# Patient Record
Sex: Female | Born: 1985 | Race: Black or African American | Hispanic: No | Marital: Married | Smoking: Never smoker
Health system: Southern US, Community
[De-identification: ages and names within clinical notes are randomized; demographics above are authoritative.]

## PROBLEM LIST (undated history)

## (undated) ENCOUNTER — Inpatient Hospital Stay (HOSPITAL_COMMUNITY): Payer: Self-pay

## (undated) DIAGNOSIS — O139 Gestational [pregnancy-induced] hypertension without significant proteinuria, unspecified trimester: Secondary | ICD-10-CM

## (undated) DIAGNOSIS — E269 Hyperaldosteronism, unspecified: Secondary | ICD-10-CM

## (undated) DIAGNOSIS — E282 Polycystic ovarian syndrome: Secondary | ICD-10-CM

## (undated) DIAGNOSIS — D649 Anemia, unspecified: Secondary | ICD-10-CM

## (undated) DIAGNOSIS — J189 Pneumonia, unspecified organism: Secondary | ICD-10-CM

## (undated) DIAGNOSIS — Z903 Acquired absence of stomach [part of]: Secondary | ICD-10-CM

## (undated) DIAGNOSIS — R011 Cardiac murmur, unspecified: Secondary | ICD-10-CM

## (undated) DIAGNOSIS — B999 Unspecified infectious disease: Secondary | ICD-10-CM

## (undated) DIAGNOSIS — F329 Major depressive disorder, single episode, unspecified: Secondary | ICD-10-CM

## (undated) DIAGNOSIS — N182 Chronic kidney disease, stage 2 (mild): Secondary | ICD-10-CM

## (undated) HISTORY — DX: Pneumonia, unspecified organism: J18.9

## (undated) HISTORY — DX: Cardiac murmur, unspecified: R01.1

## (undated) HISTORY — DX: Unspecified infectious disease: B99.9

## (undated) HISTORY — PX: TONSILLECTOMY: SUR1361

## (undated) HISTORY — DX: Hyperaldosteronism, unspecified: E26.9

## (undated) HISTORY — DX: Major depressive disorder, single episode, unspecified: F32.9

## (undated) HISTORY — DX: Acquired absence of stomach (part of): Z90.3

## (undated) HISTORY — DX: Anemia, unspecified: D64.9

---
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
## 2005-06-03 ENCOUNTER — Emergency Department (HOSPITAL_COMMUNITY): Admission: EM | Admit: 2005-06-03 | Discharge: 2005-06-04 | Payer: Self-pay | Admitting: Emergency Medicine

## 2007-01-09 ENCOUNTER — Emergency Department (HOSPITAL_COMMUNITY): Admission: EM | Admit: 2007-01-09 | Discharge: 2007-01-09 | Payer: Self-pay | Admitting: Emergency Medicine

## 2007-08-11 ENCOUNTER — Emergency Department (HOSPITAL_COMMUNITY): Admission: EM | Admit: 2007-08-11 | Discharge: 2007-08-11 | Payer: Self-pay | Admitting: Emergency Medicine

## 2007-12-03 ENCOUNTER — Emergency Department (HOSPITAL_COMMUNITY): Admission: EM | Admit: 2007-12-03 | Discharge: 2007-12-03 | Payer: Self-pay | Admitting: Emergency Medicine

## 2008-01-17 ENCOUNTER — Emergency Department (HOSPITAL_COMMUNITY): Admission: EM | Admit: 2008-01-17 | Discharge: 2008-01-17 | Payer: Self-pay | Admitting: *Deleted

## 2008-01-18 ENCOUNTER — Emergency Department (HOSPITAL_COMMUNITY): Admission: EM | Admit: 2008-01-18 | Discharge: 2008-01-18 | Payer: Self-pay | Admitting: Emergency Medicine

## 2008-02-06 ENCOUNTER — Emergency Department (HOSPITAL_COMMUNITY): Admission: EM | Admit: 2008-02-06 | Discharge: 2008-02-06 | Payer: Self-pay | Admitting: Emergency Medicine

## 2008-03-18 ENCOUNTER — Emergency Department (HOSPITAL_COMMUNITY): Admission: EM | Admit: 2008-03-18 | Discharge: 2008-03-18 | Payer: Self-pay | Admitting: Emergency Medicine

## 2008-08-24 ENCOUNTER — Emergency Department (HOSPITAL_COMMUNITY): Admission: EM | Admit: 2008-08-24 | Discharge: 2008-08-24 | Payer: Self-pay | Admitting: Emergency Medicine

## 2008-08-25 ENCOUNTER — Emergency Department (HOSPITAL_COMMUNITY): Admission: EM | Admit: 2008-08-25 | Discharge: 2008-08-26 | Payer: Self-pay | Admitting: Emergency Medicine

## 2008-08-30 ENCOUNTER — Other Ambulatory Visit: Admission: RE | Admit: 2008-08-30 | Discharge: 2008-08-30 | Payer: Self-pay | Admitting: Family Medicine

## 2008-10-04 ENCOUNTER — Encounter: Admission: RE | Admit: 2008-10-04 | Discharge: 2009-01-02 | Payer: Self-pay | Admitting: Family Medicine

## 2009-06-30 ENCOUNTER — Ambulatory Visit: Payer: Self-pay | Admitting: Oncology

## 2009-07-29 ENCOUNTER — Ambulatory Visit: Payer: Self-pay | Admitting: Oncology

## 2009-08-04 LAB — CMP (CANCER CENTER ONLY)
AST: 18 U/L (ref 11–38)
Albumin: 3.9 g/dL (ref 3.3–5.5)
Alkaline Phosphatase: 52 U/L (ref 26–84)
Potassium: 4.1 mEq/L (ref 3.3–4.7)
Sodium: 139 mEq/L (ref 128–145)
Total Bilirubin: 0.5 mg/dl (ref 0.20–1.60)
Total Protein: 8 g/dL (ref 6.4–8.1)

## 2009-08-04 LAB — IRON AND TIBC
TIBC: 383 ug/dL (ref 250–470)
UIBC: 332 ug/dL

## 2009-08-04 LAB — CBC WITH DIFFERENTIAL (CANCER CENTER ONLY)
EOS%: 1.4 % (ref 0.0–7.0)
LYMPH%: 28.8 % (ref 14.0–48.0)
MCH: 24.2 pg — ABNORMAL LOW (ref 26.0–34.0)
MCHC: 33.3 g/dL (ref 32.0–36.0)
MONO%: 4.8 % (ref 0.0–13.0)
NEUT#: 6.2 10*3/uL (ref 1.5–6.5)
Platelets: 218 10*3/uL (ref 145–400)
RDW: 16 % — ABNORMAL HIGH (ref 10.5–14.6)

## 2009-08-09 LAB — HYPERCOAGULABLE PANEL, COMPREHENSIVE
Anticardiolipin IgG: 4 GPL U/mL (ref <23)
Anticardiolipin IgM: 3 MPL U/mL (ref <11)
Beta-2 Glyco I IgG: 0 G Units (ref <20)
DRVVT 1:1 Mix: 42.3 secs (ref 36.2–44.3)
DRVVT: 50.1 secs — ABNORMAL HIGH (ref 36.2–44.3)
Homocysteine: 8.3 umol/L (ref 4.0–15.4)
PTTLA 4:1 Mix: 45.8 secs (ref 36.3–48.8)
Protein C Activity: 170 % — ABNORMAL HIGH (ref 75–133)
Protein C, Total: 121 % (ref 70–140)

## 2009-08-31 ENCOUNTER — Ambulatory Visit: Payer: Self-pay | Admitting: Oncology

## 2009-12-14 ENCOUNTER — Ambulatory Visit: Payer: Self-pay | Admitting: Oncology

## 2010-01-04 ENCOUNTER — Other Ambulatory Visit: Admission: RE | Admit: 2010-01-04 | Discharge: 2010-01-04 | Payer: Self-pay | Admitting: Family Medicine

## 2010-01-05 ENCOUNTER — Ambulatory Visit: Payer: Self-pay | Admitting: Oncology

## 2010-03-19 DIAGNOSIS — D649 Anemia, unspecified: Secondary | ICD-10-CM

## 2010-03-19 DIAGNOSIS — F32A Depression, unspecified: Secondary | ICD-10-CM

## 2010-03-19 HISTORY — PX: OTHER SURGICAL HISTORY: SHX169

## 2010-03-19 HISTORY — DX: Anemia, unspecified: D64.9

## 2010-03-19 HISTORY — DX: Depression, unspecified: F32.A

## 2010-04-29 ENCOUNTER — Emergency Department (HOSPITAL_COMMUNITY)
Admission: EM | Admit: 2010-04-29 | Discharge: 2010-04-29 | Disposition: A | Discharge status: Home / Self Care | Payer: BC Managed Care – PPO | Attending: Emergency Medicine | Admitting: Emergency Medicine

## 2010-04-29 DIAGNOSIS — I10 Essential (primary) hypertension: Secondary | ICD-10-CM | POA: Insufficient documentation

## 2010-04-29 DIAGNOSIS — L293 Anogenital pruritus, unspecified: Secondary | ICD-10-CM | POA: Insufficient documentation

## 2010-04-29 DIAGNOSIS — A499 Bacterial infection, unspecified: Secondary | ICD-10-CM | POA: Insufficient documentation

## 2010-04-29 DIAGNOSIS — B9689 Other specified bacterial agents as the cause of diseases classified elsewhere: Secondary | ICD-10-CM | POA: Insufficient documentation

## 2010-04-29 DIAGNOSIS — N760 Acute vaginitis: Secondary | ICD-10-CM | POA: Insufficient documentation

## 2010-04-29 DIAGNOSIS — Z79899 Other long term (current) drug therapy: Secondary | ICD-10-CM | POA: Insufficient documentation

## 2010-04-29 LAB — URINALYSIS, ROUTINE W REFLEX MICROSCOPIC
Bilirubin Urine: NEGATIVE
Ketones, ur: NEGATIVE mg/dL
Protein, ur: NEGATIVE mg/dL
Urobilinogen, UA: 0.2 mg/dL (ref 0.0–1.0)

## 2010-04-29 LAB — URINE MICROSCOPIC-ADD ON

## 2010-04-29 LAB — WET PREP, GENITAL
Trich, Wet Prep: NONE SEEN
WBC, Wet Prep HPF POC: NONE SEEN
Yeast Wet Prep HPF POC: NONE SEEN

## 2010-05-02 LAB — GC/CHLAMYDIA PROBE AMP, GENITAL: GC Probe Amp, Genital: NEGATIVE

## 2010-06-26 LAB — POCT PREGNANCY, URINE: Preg Test, Ur: NEGATIVE

## 2010-12-13 LAB — URINALYSIS, ROUTINE W REFLEX MICROSCOPIC
Glucose, UA: NEGATIVE
Ketones, ur: NEGATIVE
Nitrite: NEGATIVE
Protein, ur: NEGATIVE
Urobilinogen, UA: 0.2

## 2010-12-13 LAB — POCT I-STAT, CHEM 8
Calcium, Ion: 1.24
Creatinine, Ser: 1.1
Glucose, Bld: 87
HCT: 42
Hemoglobin: 14.3
Potassium: 3.6
TCO2: 29

## 2010-12-13 LAB — PREGNANCY, URINE: Preg Test, Ur: NEGATIVE

## 2010-12-18 LAB — COMPREHENSIVE METABOLIC PANEL
Albumin: 3.9
BUN: 11
CO2: 29
Calcium: 9.6
Chloride: 105
Creatinine, Ser: 0.76
GFR calc non Af Amer: >60
Total Bilirubin: 0.6

## 2010-12-18 LAB — DIFFERENTIAL
Basophils Absolute: 0.1
Lymphocytes Relative: 33
Lymphs Abs: 4
Neutro Abs: 7.2

## 2010-12-18 LAB — CBC
HCT: 37.9
MCHC: 33.3
MCV: 79.1
Platelets: 203
WBC: 12.2 — ABNORMAL HIGH

## 2010-12-18 LAB — URINALYSIS, ROUTINE W REFLEX MICROSCOPIC
Bilirubin Urine: NEGATIVE
Hgb urine dipstick: NEGATIVE
Ketones, ur: NEGATIVE
Nitrite: NEGATIVE
Specific Gravity, Urine: 1.031 — ABNORMAL HIGH
Urobilinogen, UA: 0.2

## 2010-12-19 LAB — URINALYSIS, ROUTINE W REFLEX MICROSCOPIC
Nitrite: NEGATIVE
Specific Gravity, Urine: 1.016
Urobilinogen, UA: 1

## 2010-12-19 LAB — POCT PREGNANCY, URINE: Preg Test, Ur: NEGATIVE

## 2010-12-22 LAB — URINALYSIS, ROUTINE W REFLEX MICROSCOPIC
Bilirubin Urine: NEGATIVE
Glucose, UA: NEGATIVE mg/dL
Hgb urine dipstick: NEGATIVE
Specific Gravity, Urine: 1.015 (ref 1.005–1.030)
pH: 6 (ref 5.0–8.0)

## 2010-12-22 LAB — URINE CULTURE: Colony Count: 25000

## 2010-12-22 LAB — POCT PREGNANCY, URINE: Preg Test, Ur: NEGATIVE

## 2012-03-20 LAB — OB RESULTS CONSOLE HGB/HCT, BLOOD: HCT: 34 %

## 2012-03-20 LAB — OB RESULTS CONSOLE ABO/RH

## 2012-03-20 LAB — OB RESULTS CONSOLE RPR: RPR: NONREACTIVE

## 2012-03-20 LAB — OB RESULTS CONSOLE GC/CHLAMYDIA
Chlamydia: NEGATIVE
Gonorrhea: NEGATIVE

## 2012-03-20 LAB — OB RESULTS CONSOLE HEPATITIS B SURFACE ANTIGEN: Hepatitis B Surface Ag: NEGATIVE

## 2012-03-20 LAB — OB RESULTS CONSOLE PLATELET COUNT: Platelets: 183 10*3/uL

## 2012-04-21 ENCOUNTER — Emergency Department (INDEPENDENT_AMBULATORY_CARE_PROVIDER_SITE_OTHER)
Admission: EM | Admit: 2012-04-21 | Discharge: 2012-04-21 | Disposition: A | Discharge status: Home / Self Care | Payer: Managed Care, Other (non HMO) | Source: Home / Self Care

## 2012-04-21 ENCOUNTER — Encounter (HOSPITAL_COMMUNITY): Payer: Self-pay

## 2012-04-21 DIAGNOSIS — Z3490 Encounter for supervision of normal pregnancy, unspecified, unspecified trimester: Secondary | ICD-10-CM

## 2012-04-21 DIAGNOSIS — A084 Viral intestinal infection, unspecified: Secondary | ICD-10-CM

## 2012-04-21 DIAGNOSIS — A088 Other specified intestinal infections: Secondary | ICD-10-CM

## 2012-04-21 LAB — POCT I-STAT, CHEM 8
Calcium, Ion: 1.16 mmol/L (ref 1.12–1.23)
Chloride: 107 mEq/L (ref 96–112)
Glucose, Bld: 85 mg/dL (ref 70–99)
HCT: 37 % (ref 36.0–46.0)
TCO2: 22 mmol/L (ref 0–100)

## 2012-04-21 MED ORDER — SODIUM CHLORIDE 0.9 % IV SOLN
Freq: Once | INTRAVENOUS | Status: AC
  Administered 2012-04-21: 15:00:00 via INTRAVENOUS

## 2012-04-21 MED ORDER — ONDANSETRON HCL 4 MG/2ML IJ SOLN
INTRAMUSCULAR | Status: AC
  Filled 2012-04-21: qty 2

## 2012-04-21 MED ORDER — ONDANSETRON HCL 4 MG PO TABS: 4.0000 mg | ORAL_TABLET | Freq: Four times a day (QID) | ORAL | Status: DC

## 2012-04-21 MED ORDER — ONDANSETRON HCL 4 MG/2ML IJ SOLN
4.0000 mg | Freq: Once | INTRAMUSCULAR | Status: AC
  Administered 2012-04-21: 4 mg via INTRAVENOUS

## 2012-04-21 NOTE — ED Provider Notes (Signed)
History     CSN: [id number]  Arrival date & time 04/21/12  1233   First MD Initiated Contact with Patient 04/21/12 1342      Chief Complaint  Patient presents with  . Abdominal Pain    (Consider location/radiation/quality/duration/timing/severity/associated sxs/prior treatment) HPI Comments: 27 year old female presents with nausea vomiting and diarrhea since 1 AM today. She states that the only new food that she has eaten was a salad yesterday. She is at [redacted] weeks gestation. She describes the abdominal discomfort as crampy in nature and includes her entire abdomen. She denies fever, chills, headache, sore throat, chest pain, dyspnea. Denies urinary symptoms, vaginal discharge or bleeding. She is actively vomiting during the H&P. She does have a primary care provider as well as an obstetrician.   History reviewed. No pertinent past medical history.  History reviewed. No pertinent past surgical history.  History reviewed. No pertinent family history.  History  Substance Use Topics  . Smoking status: Never Smoker   . Smokeless tobacco: Not on file  . Alcohol Use: No    OB History    Grav Para Term Preterm Abortions TAB SAB Ect Mult Living   1               Review of Systems  Constitutional: Positive for activity change and appetite change. Negative for fever.  HENT: Negative.   Respiratory: Negative.   Cardiovascular: Negative.   Gastrointestinal: Positive for nausea, vomiting, abdominal pain and diarrhea. Negative for constipation, blood in stool and abdominal distention.  Genitourinary: Negative.   Musculoskeletal: Negative.   Skin: Negative.   Neurological: Negative.   Psychiatric/Behavioral: Negative.     Allergies  Review of patient's allergies indicates no known allergies.  Home Medications  No current outpatient prescriptions on file.  BP 153/95  Pulse 104  Temp 98.9 F (37.2 C) (Oral)  Resp 22  SpO2 100%  LMP 12/29/2011  Physical Exam  Nursing  note and vitals reviewed. Constitutional: She appears well-developed and well-nourished. No distress.  Eyes: Conjunctivae normal and EOM are normal.  Neck: Normal range of motion. Neck supple.  Cardiovascular: Regular rhythm and normal heart sounds.        Borderline tachycardia  Pulmonary/Chest: Effort normal and breath sounds normal. No respiratory distress. She has no wheezes. She has no rales.  Abdominal: Soft.       Mild tenderness over most of the abdomen. Her abdomen is obese with a fat layer pain over the pelvis. It is difficult to determine whether there is direct tenderness over the pelvis or uterus. No pain was elicited when the probe of the Doppler was pressing directly over the uterus.  Musculoskeletal: Normal range of motion. She exhibits no tenderness.  Skin: Skin is warm and dry.  Psychiatric: She has a normal mood and affect.  FHT's regular at 160 bpm.   ED Course  Procedures (including critical care time)   Labs Reviewed  POCT I-STAT, CHEM 8   No results found.   1. Viral gastroenteritis   2. Pregnancy       MDM   Results for orders placed during the hospital encounter of 04/21/12  POCT I-STAT, CHEM 8      Component Value Range   Sodium 135  135 - 145 mEq/L   Potassium 4.9  3.5 - 5.1 mEq/L   Chloride 107  96 - 112 mEq/L   BUN 10  6 - 23 mg/dL   Creatinine, Ser 1.30  0.50 - 1.10 mg/dL  Glucose, Bld 85  70 - 99 mg/dL   Calcium, Ion 1.61  0.96 - 1.23 mmol/L   TCO2 22  0 - 100 mmol/L   Hemoglobin 12.6  12.0 - 15.0 g/dL   HCT 04.5  40.9 - 81.1 %   Patient states she is feeling better after 1 L of normal saline and 4 mg of Zofran IV. She will be discharged home with a prescription for Zofran 4 mg to take one tablet every 6 hours as needed for nausea and vomiting. She is to drink plenty of fluids to stay well hydrated and recommend Pedialyte to augment the of fluids. If having diarrhea over 3 times a day may take Imodium one tablet only to the point to  slow the diarrhea frequency. Do not follow the directions on the box. Do not take 2 tablets at once. Only take 1 tablet at a time but separated by 6-8 hours and only if having frequent diarrhea stools.  The goal is to slow the diarrhea but not stop it. If you are not having frequent diarrhea do not take this medicine. You must allow the diarrhea to shed the virus from the body. The above information is explained to both the patient and her significant other. For additional problems I recommended going to the [facility] hospital because they have diagnostic means in which to evaluate the condition or status of the pregnancy. Consulted with her obstetrician Dr. Casey Burkitt who agreed with the plan at 4:15PM. DMabe.         Hayden Rasmussen, NP 04/21/12 [id number]

## 2012-04-21 NOTE — ED Notes (Signed)
Attempted to auscultate fetal heart tones w/o success. Pt states she has been able to feel usual fetal movement , denies vaginal bleeding or d/c ; c/o general abdominal cramping

## 2012-04-21 NOTE — ED Notes (Signed)
Out of town yesterday, ate a salad; developed abd pain, cramping; "I think I have food poisoning "; concerned as she is PREGNANT!!!

## 2012-04-22 NOTE — ED Provider Notes (Signed)
Medical screening examination/treatment/procedure(s) were performed by resident physician or non-physician practitioner and as supervising physician I was immediately available for consultation/collaboration.   Barkley Bruns MD.    Linna Hoff, MD 04/22/12 1310

## 2012-05-06 ENCOUNTER — Inpatient Hospital Stay (HOSPITAL_COMMUNITY)
Admission: AD | Admit: 2012-05-06 | Discharge: 2012-05-06 | Disposition: A | Discharge status: Hospice | Payer: Managed Care, Other (non HMO) | Source: Ambulatory Visit | Attending: Obstetrics and Gynecology | Admitting: Obstetrics and Gynecology

## 2012-05-06 ENCOUNTER — Encounter (HOSPITAL_COMMUNITY): Payer: Self-pay | Admitting: *Deleted

## 2012-05-06 ENCOUNTER — Inpatient Hospital Stay (HOSPITAL_COMMUNITY): Payer: Managed Care, Other (non HMO)

## 2012-05-06 DIAGNOSIS — S3991XA Unspecified injury of abdomen, initial encounter: Secondary | ICD-10-CM

## 2012-05-06 DIAGNOSIS — W2209XA Striking against other stationary object, initial encounter: Secondary | ICD-10-CM | POA: Insufficient documentation

## 2012-05-06 DIAGNOSIS — O99891 Other specified diseases and conditions complicating pregnancy: Secondary | ICD-10-CM | POA: Insufficient documentation

## 2012-05-06 DIAGNOSIS — Y92009 Unspecified place in unspecified non-institutional (private) residence as the place of occurrence of the external cause: Secondary | ICD-10-CM | POA: Insufficient documentation

## 2012-05-06 MED ORDER — OXYCODONE-ACETAMINOPHEN 5-325 MG PO TABS
1.0000 | ORAL_TABLET | Freq: Once | ORAL | Status: AC
  Administered 2012-05-06: 1 via ORAL
  Filled 2012-05-06: qty 1

## 2012-05-06 NOTE — MAU Note (Signed)
Pt reports she was moving a large box about 3 hours ago and hit the edge of the door and "rammed" the box into her lower abd. Since that time she has had cramping and pressure in her lower abd. Denies vaginal bleeding.

## 2012-05-06 NOTE — MAU Note (Signed)
Patient is in with c/o left mid abdomen soreness/pain after hitting her abdomen on the door at about 1600pm today. No bruising or redness noted. fht 159-163. Patient denies vaginal bleeding

## 2012-05-06 NOTE — MAU Provider Note (Signed)
  History     CSN: [id number]  Arrival date and time: 05/06/12 1910   None     Chief Complaint  Patient presents with  . Abdominal Injury   HPI  Tracey Alvarez is a 27 y.o. G1P0 at [redacted]w[redacted]d who presents today because she was moving a box, and it got caught on the door and she hit her abdomen. She denies any bleeding or LOF. She has not started to feel fetal movement at this time. She states that she bruises very easily and is concerned about the trauma to her abdomen. She reports 7/10 pain that feels like "she got hit". She states it is a dull aching pain.  History reviewed. No pertinent past medical history.  History reviewed. No pertinent past surgical history.  History reviewed. No pertinent family history.  History  Substance Use Topics  . Smoking status: Never Smoker   . Smokeless tobacco: Not on file  . Alcohol Use: No    Allergies: No Known Allergies  Prescriptions prior to admission  Medication Sig Dispense Refill  . acetaminophen (TYLENOL) 500 MG tablet Take 500 mg by mouth every 6 (six) hours as needed for pain.      Marland Kitchen ondansetron (ZOFRAN) 4 MG tablet Take 1 tablet (4 mg total) by mouth every 6 (six) hours. As needed for nausea or vomiting  15 tablet  0    Review of Systems  Neurological: Negative for dizziness.  Endo/Heme/Allergies: Bruises/bleeds easily.   Physical Exam   Blood pressure 123/54, pulse 74, temperature 98.6 F (37 C), temperature source Oral, resp. rate 20, height 5\' 8"  (1.727 m), weight 122.471 kg (270 lb), SpO2 100.00%.  Physical Exam  Nursing note and vitals reviewed. Constitutional: She is oriented to person, place, and time. She appears well-developed and well-nourished. No distress.  Cardiovascular: Normal rate.   Respiratory: Effort normal.  GI: Soft. She exhibits no distension. There is no tenderness. There is no rebound.  Neurological: She is alert and oriented to person, place, and time.  Skin: Skin is warm and dry.  Psychiatric:  She has a normal mood and affect.    MAU Course  Procedures  *RADIOLOGY REPORT*  Clinical Data: 27 year old female. Blunt trauma. Cramping.  LIMITED OBSTETRIC ULTRASOUND  Number of Fetuses: 1  Heart Rate: 160 bpm  Movement: Present  Presentation: Breech  Placental Location:: Posterolateral on the left  Previa: None  Amniotic Fluid (Subjective): Normal  Vertical Pocket 4.5cm  BPD: 3.3 cm) 16 w 2d EDC: 10/19/2012  MATERNAL FINDINGS:  Cervix: Closed, 3.5 cm in length.  Uterus/Adnexae: Right ovary appears normal measuring 2.8 x 5.1 x  2.7 cm. The left ovary appears normal measuring 2.3 x 4.5 x 1.8  cm. No pelvic free fluid identified.  IMPRESSION:  Viable singleton intrauterine pregnancy with estimated gestational  age of [redacted] weeks and 2 days by biparietal diameter. No uterus or  adnexal abnormality identified.  Recommend followup with non-emergent complete OB 14+ wk US  examination for fetal biometric evaluation and anatomic survey if  not already performed.  Original Report Authenticated By: Erskine Speed, M.D.  2113: Spoke with Dr. Ambrose Mantle, pt is ok for DC home.  Assessment and Plan   1. Blunt abdominal trauma    2nd trimester danger signs reviewed FU with PCP as scheduled  Tawnya Crook 05/06/2012, 8:50 PM

## 2012-05-15 ENCOUNTER — Encounter: Payer: Private Health Insurance - Indemnity | Admitting: Certified Nurse Midwife

## 2012-05-21 ENCOUNTER — Ambulatory Visit: Payer: Private Health Insurance - Indemnity | Admitting: Obstetrics and Gynecology

## 2012-05-21 ENCOUNTER — Encounter: Payer: Self-pay | Admitting: Obstetrics and Gynecology

## 2012-05-21 DIAGNOSIS — Z3689 Encounter for other specified antenatal screening: Secondary | ICD-10-CM

## 2012-05-21 LAB — POCT URINALYSIS DIPSTICK
Blood, UA: NEGATIVE
Glucose, UA: NEGATIVE
Ketones, UA: NEGATIVE
Spec Grav, UA: 1.015

## 2012-05-21 NOTE — Progress Notes (Signed)
NOB interview. Pt transferring from [facility] with records. Pt has had all labs, including 1st trimester screen and AFP. EDC based on 7 wk U/S. Sched for anatomy U/S and NOB W/U with CA 05/29/12. Records available in medical records.

## 2012-05-29 ENCOUNTER — Encounter: Payer: Private Health Insurance - Indemnity | Admitting: Certified Nurse Midwife

## 2012-05-29 ENCOUNTER — Ambulatory Visit: Payer: Private Health Insurance - Indemnity

## 2012-05-29 DIAGNOSIS — Z3689 Encounter for other specified antenatal screening: Secondary | ICD-10-CM

## 2012-05-29 LAB — US OB COMP + 14 WK

## 2012-06-12 ENCOUNTER — Other Ambulatory Visit: Payer: Self-pay | Admitting: Obstetrics and Gynecology

## 2012-06-12 LAB — CBC
MCH: 25.6 pg — ABNORMAL LOW (ref 26.0–34.0)
MCHC: 33.2 g/dL (ref 30.0–36.0)
Platelets: 156 10*3/uL (ref 150–400)
RDW: 15.3 % (ref 11.5–15.5)

## 2012-06-13 LAB — GLUCOSE TOLERANCE, 1 HOUR (50G) W/O FASTING: Glucose, 1 Hour GTT: 116 mg/dL (ref 70–140)

## 2012-06-13 LAB — RPR

## 2012-06-21 ENCOUNTER — Inpatient Hospital Stay (HOSPITAL_COMMUNITY)
Admission: AD | Admit: 2012-06-21 | Discharge: 2012-06-21 | Disposition: A | Discharge status: Home / Self Care | Payer: Managed Care, Other (non HMO) | Source: Ambulatory Visit | Attending: Obstetrics and Gynecology | Admitting: Obstetrics and Gynecology

## 2012-06-21 ENCOUNTER — Encounter (HOSPITAL_COMMUNITY): Payer: Self-pay | Admitting: Obstetrics and Gynecology

## 2012-06-21 DIAGNOSIS — O368121 Decreased fetal movements, second trimester, fetus 1: Secondary | ICD-10-CM

## 2012-06-21 DIAGNOSIS — O368190 Decreased fetal movements, unspecified trimester, not applicable or unspecified: Secondary | ICD-10-CM | POA: Insufficient documentation

## 2012-06-21 DIAGNOSIS — R109 Unspecified abdominal pain: Secondary | ICD-10-CM | POA: Insufficient documentation

## 2012-06-21 LAB — URINALYSIS, ROUTINE W REFLEX MICROSCOPIC
Bilirubin Urine: NEGATIVE
Hgb urine dipstick: NEGATIVE
Ketones, ur: 15 mg/dL — AB
Protein, ur: NEGATIVE mg/dL
Urobilinogen, UA: 0.2 mg/dL (ref 0.0–1.0)

## 2012-06-21 NOTE — MAU Provider Note (Signed)
History   CSN: [id number]  Arrival date and time: 06/21/12 1534   Chief Complaint  Patient presents with  . Abdominal Cramping  . Decreased Fetal Movement   HPI Pt presents to MAU at 22w 6d with complaints of decreased fetal movement over the past two days as well as lower abdominal pain and cramping last night.  Pt states she has been feeling regular fetal movement since approx [redacted]wks gestation.  She states she has only felt the baby move 1-2 times today which is decreased.  She reports intermittent lower abdominal pain last night which she describes as cramping and was midline in location.  She reports the pain persisted for approx 1 minute then resolved.  She states the pain was unchanged by movement.  She did not treat her pain.   She ranks the pain as 6/10 when it occurred.  She states the pain occurred approx 4 to 5 times in an approximate 6hr period of time.  She denies any unusual vaginal discharge or vaginal bleeding.  She denies constipation or gassiness.  She states she has not experienced the pain today.  She denies any changes in activity yesterday.    OB History   Grav Para Term Preterm Abortions TAB SAB Ect Mult Living   1               Past Medical History  Diagnosis Date  . Pneumonia     AS TEEN  . Depression 2012    POST SURGERY; MEDS X 2 WEEKS  . Infection 2005    UIT  . Infection     YEAST X 1  . Anemia 2012    Past Surgical History  Procedure Laterality Date  . Vertical sleeve gasgtrectomy  2012  . Tonsillectomy  AGE 4    Family History  Problem Relation Age of Onset  . Anemia Mother   . Seizures Brother     CHILDHOOD  . Diabetes Maternal Aunt   . Lupus Maternal Aunt   . Aneurysm Maternal Uncle   . Arthritis Maternal Grandmother   . Diabetes Maternal Grandmother   . Hyperlipidemia Maternal Grandmother   . Hypertension Maternal Grandmother   . Stroke Maternal Grandmother   . Heart disease Maternal Grandfather   . Hyperlipidemia Maternal  Grandfather   . Stroke Maternal Grandfather   . Lupus Cousin     History  Substance Use Topics  . Smoking status: Never Smoker   . Smokeless tobacco: Never Used  . Alcohol Use: Yes     Comment: RARELY    Allergies:  Allergies  Allergen Reactions  . Shellfish Allergy Shortness Of Breath    Prescriptions prior to admission  Medication Sig Dispense Refill  . acetaminophen (TYLENOL) 500 MG tablet Take 500 mg by mouth every 6 (six) hours as needed for pain.      . Prenatal Vit-Fe Fumarate-FA (PRENATAL MULTIVITAMIN) TABS Take 1 tablet by mouth daily at 12 noon.        Review of Systems  Constitutional: Negative.   HENT: Negative.   Eyes: Negative.   Respiratory: Negative.   Cardiovascular: Negative.   Gastrointestinal: Positive for heartburn and nausea.       Occas heartburn relieved with Tums and occas nausea, no vomiting.  Genitourinary: Negative.   Musculoskeletal: Negative.   Skin: Negative.   Neurological: Negative.   Endo/Heme/Allergies: Negative.   Psychiatric/Behavioral: Negative.    Physical Exam   Blood pressure 134/79, pulse 79, resp. rate 18, height 5' 8.5" (  1.74 m), weight 278 lb 9.6 oz (126.372 kg), last menstrual period 12/29/2011.  Physical Exam  Constitutional: She is oriented to person, place, and time. She appears well-developed and well-nourished.  HENT:  Head: Normocephalic and atraumatic.  Right Ear: External ear normal.  Left Ear: External ear normal.  Nose: Nose normal.  Eyes: Conjunctivae are normal. Pupils are equal, round, and reactive to light.  Neck: Normal range of motion. Neck supple. No thyromegaly present.  Cardiovascular: Normal rate, regular rhythm and intact distal pulses.   Respiratory: Effort normal and breath sounds normal.  GI: Soft. Bowel sounds are normal. She exhibits no distension. There is no tenderness. There is no rebound and no guarding.  Genitourinary: Uterus normal.  Speculum exam deferred.  Ext genitalia WNL.  BUS  neg.  SVE: closed/thick/no palpable presenting part in pelvis.  Neg CMT.  Ut soft, mobile, non-tender.  Fetal heart rate 158 bpm with doppler.  Musculoskeletal: Normal range of motion.  Neurological: She is alert and oriented to person, place, and time. She has normal reflexes.  Skin: Skin is warm and dry.  Psychiatric: She has a normal mood and affect. Her behavior is normal.    MAU Course  Procedures Results for orders placed during the hospital encounter of 06/21/12 (from the past 24 hour(s))  URINALYSIS, ROUTINE W REFLEX MICROSCOPIC     Status: Abnormal   Collection Time    06/21/12  3:50 PM      Result Value Range   Color, Urine YELLOW  YELLOW   APPearance CLEAR  CLEAR   Specific Gravity, Urine 1.020  1.005 - 1.030   pH 6.0  5.0 - 8.0   Glucose, UA NEGATIVE  NEGATIVE mg/dL   Hgb urine dipstick NEGATIVE  NEGATIVE   Bilirubin Urine NEGATIVE  NEGATIVE   Ketones, ur 15 (*) NEGATIVE mg/dL   Protein, ur NEGATIVE  NEGATIVE mg/dL   Urobilinogen, UA 0.2  0.0 - 1.0 mg/dL   Nitrite NEGATIVE  NEGATIVE   Leukocytes, UA NEGATIVE  NEGATIVE    Assessment and Plan  IUP at 22w 6d Decreased fetal movement Abdominal cramping-resolved Fetal movement patterns at the current gestational age d/w pt.   Revd s/s preterm labor.  F/U as sched at [facility] on 07/10/12 or call for persistent or worsening symptoms.   Judithann Villamar O. 06/21/2012, 4:03 PM

## 2012-06-21 NOTE — MAU Note (Signed)
Pt presents with complaints of cramping since last night and a decrease in fetal movement. States she started feeling the baby move at 19 weeks. Denies any bleeding or LOF.

## 2012-06-21 NOTE — MAU Note (Signed)
Patient presents to MAU with c/o decreased fetal movement. Reports intermittent cramping began yesterday at 2000. Reports last good fetal movement two days ago.  Denies vaginal bleeding, LOF. Last sexual intercourse Monday.

## 2012-08-11 ENCOUNTER — Inpatient Hospital Stay (HOSPITAL_COMMUNITY)
Admission: AD | Admit: 2012-08-11 | Discharge: 2012-08-11 | Disposition: A | Discharge status: Home / Self Care | Payer: Medicaid Other | Source: Ambulatory Visit | Attending: Obstetrics and Gynecology | Admitting: Obstetrics and Gynecology

## 2012-08-11 ENCOUNTER — Encounter (HOSPITAL_COMMUNITY): Payer: Self-pay | Admitting: *Deleted

## 2012-08-11 DIAGNOSIS — O26849 Uterine size-date discrepancy, unspecified trimester: Secondary | ICD-10-CM | POA: Diagnosis present

## 2012-08-11 DIAGNOSIS — R638 Other symptoms and signs concerning food and fluid intake: Secondary | ICD-10-CM | POA: Diagnosis present

## 2012-08-11 DIAGNOSIS — D649 Anemia, unspecified: Secondary | ICD-10-CM | POA: Insufficient documentation

## 2012-08-11 DIAGNOSIS — O368190 Decreased fetal movements, unspecified trimester, not applicable or unspecified: Secondary | ICD-10-CM | POA: Insufficient documentation

## 2012-08-11 DIAGNOSIS — Z903 Acquired absence of stomach [part of]: Secondary | ICD-10-CM

## 2012-08-11 DIAGNOSIS — O99019 Anemia complicating pregnancy, unspecified trimester: Secondary | ICD-10-CM | POA: Insufficient documentation

## 2012-08-11 DIAGNOSIS — R0602 Shortness of breath: Secondary | ICD-10-CM | POA: Insufficient documentation

## 2012-08-11 LAB — URINALYSIS, ROUTINE W REFLEX MICROSCOPIC
Bilirubin Urine: NEGATIVE
Glucose, UA: NEGATIVE mg/dL
Hgb urine dipstick: NEGATIVE
Ketones, ur: NEGATIVE mg/dL
Protein, ur: NEGATIVE mg/dL

## 2012-08-11 NOTE — MAU Note (Signed)
Patient states she has not felt any fetal movement in the last 2-3 hours. Having lower back pain. Denies vaginal bleeding and leaking of fluid.

## 2012-08-11 NOTE — MAU Provider Note (Signed)
History   27 yo G1P0 at 72 1/7 weeks presented c/o decreased FM last 2 days, with 5 movements yesterday and 3 today.  Denied leaking, bleeding, dysuria, HA, or contractions.  Did have episode today where she felt slightly short of breath, with perception of her heart beating fast.  Has had shortness of breath before during pregnancy.  08/07/12: Glucola 135--office will schedule 3 hour GTT. Hgb 9.7 (down from 10.4 at early glucola)--taking Fe once/day.  Patient Active Problem List   Diagnosis Date Noted  . Increased BMI 08/11/2012  . Uterine size date discrepancy 08/11/2012  . 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 Decreased Fetal Movement    OB History   Grav Para Term Preterm Abortions TAB SAB Ect Mult Living   1               Past Medical History  Diagnosis Date  . Pneumonia     AS TEEN  . Depression 2012    POST SURGERY; MEDS X 2 WEEKS  . Infection 2005    UIT  . Infection     YEAST X 1  . Anemia 2012    Past Surgical History  Procedure Laterality Date  . Vertical sleeve gasgtrectomy  2012  . Tonsillectomy  AGE 21    Family History  Problem Relation Age of Onset  . Anemia Mother   . Seizures Brother     CHILDHOOD  . Diabetes Maternal Aunt   . Lupus Maternal Aunt   . Aneurysm Maternal Uncle   . Arthritis Maternal Grandmother   . Diabetes Maternal Grandmother   . Hyperlipidemia Maternal Grandmother   . Hypertension Maternal Grandmother   . Stroke Maternal Grandmother   . Heart disease Maternal Grandfather   . Hyperlipidemia Maternal Grandfather   . Stroke Maternal Grandfather   . Lupus Cousin     History  Substance Use Topics  . Smoking status: Never Smoker   . Smokeless tobacco: Never Used  . Alcohol Use: Yes     Comment: RARELY    Allergies:  Allergies  Allergen Reactions  . Shellfish Allergy Shortness Of Breath    Prescriptions prior to admission  Medication Sig Dispense Refill  .  acetaminophen (TYLENOL) 500 MG tablet Take 500 mg by mouth every 6 (six) hours as needed for pain.      . Prenatal Vit-Fe Fumarate-FA (PRENATAL MULTIVITAMIN) TABS Take 1 tablet by mouth daily at 12 noon.         Physical Exam   Blood pressure 127/60, pulse 98, temperature 97.9 F (36.6 C), resp. rate 20, height 5\' 8"  (1.727 m), weight 287 lb (130.182 kg), last menstrual period 12/29/2011, SpO2 100.00%.  Chest clear Heart RRR without murmur Abd gravid, NT Pelvic--deferred Ext WNL  FHR reassuring, but currently non-reactive. UCs none  ED Course  IUP at 30 1/7 weeks Decreased FM Mild anemia  Plan: NST If remains non-reactive, will plan BPP.   Nigel Bridgeman CNM, [place] 08/11/2012 10:19 PM  Addendum:  Patient has now felt several fetal movements. NST reactive, with several accelerations noted.  No decels. No contractions.  Bedside US shows fetus in vtx presentation, with movement of torso noted, with patient noting that movement and other movement of extremities.  Reviewed results of 3 hour GTT and plan for office to call and schedule 3 hour GTT. Reviewed Hgb of 9.7, with recommendations for increasing Fe to BID. Keep scheduled appt on  6/6 for [place] due to S>D. Reviewed [facility] process.  Nigel Bridgeman, CNM, [place] 08/11/12 11:15pm

## 2012-09-03 ENCOUNTER — Encounter: Payer: Medicaid Other | Attending: Obstetrics and Gynecology | Admitting: *Deleted

## 2012-09-03 VITALS — Ht 68.0 in | Wt 288.2 lb

## 2012-09-03 DIAGNOSIS — O99810 Abnormal glucose complicating pregnancy: Secondary | ICD-10-CM | POA: Insufficient documentation

## 2012-09-03 DIAGNOSIS — Z713 Dietary counseling and surveillance: Secondary | ICD-10-CM | POA: Insufficient documentation

## 2012-09-04 ENCOUNTER — Encounter: Payer: Self-pay | Admitting: *Deleted

## 2012-09-04 NOTE — Progress Notes (Signed)
  Patient was seen on 09/03/12 for Gestational Diabetes self-management class at the [facility]. The following learning objectives were met by the patient during this course:   States the definition of Gestational Diabetes  States why dietary management is important in controlling blood glucose  Describes the effects each nutrient has on blood glucose levels  Demonstrates ability to create a balanced meal plan  Demonstrates carbohydrate counting   States when to check blood glucose levels  Demonstrates proper blood glucose monitoring techniques  States the effect of stress and exercise on blood glucose levels  States the importance of limiting caffeine and abstaining from alcohol and smoking  Blood glucose monitor given: [employer] EZ kit Lot # [id number] Exp: 07/2013 Blood glucose reading: 67 mg/dl  Patient instructed to monitor glucose levels: FBS: 60 - <90 2 hour: <120  *Patient received handouts:  Nutrition Diabetes and Pregnancy  Carbohydrate Counting List  Patient will be seen for follow-up as needed.

## 2012-09-04 NOTE — Patient Instructions (Signed)
Goals:  Check glucose levels per MD as instructed  Follow Gestational Diabetes Diet as instructed  Call for follow-up as needed    

## 2012-10-07 ENCOUNTER — Telehealth (HOSPITAL_COMMUNITY): Payer: Self-pay | Admitting: *Deleted

## 2012-10-07 ENCOUNTER — Encounter (HOSPITAL_COMMUNITY): Payer: Self-pay | Admitting: *Deleted

## 2012-10-07 LAB — OB RESULTS CONSOLE GBS: GBS: POSITIVE

## 2012-10-07 NOTE — Telephone Encounter (Signed)
Preadmission screen  

## 2012-10-09 ENCOUNTER — Encounter (HOSPITAL_COMMUNITY): Payer: Self-pay | Admitting: *Deleted

## 2012-10-09 ENCOUNTER — Telehealth (HOSPITAL_COMMUNITY): Payer: Self-pay | Admitting: *Deleted

## 2012-10-09 ENCOUNTER — Encounter: Payer: Self-pay | Admitting: Obstetrics and Gynecology

## 2012-10-09 DIAGNOSIS — O24419 Gestational diabetes mellitus in pregnancy, unspecified control: Secondary | ICD-10-CM | POA: Insufficient documentation

## 2012-10-09 DIAGNOSIS — Z8632 Personal history of gestational diabetes: Secondary | ICD-10-CM | POA: Insufficient documentation

## 2012-10-09 NOTE — Telephone Encounter (Signed)
Preadmission screen  

## 2012-10-12 ENCOUNTER — Inpatient Hospital Stay (HOSPITAL_COMMUNITY)
Admission: RE | Admit: 2012-10-12 | Payer: Managed Care, Other (non HMO) | Source: Ambulatory Visit | Attending: Obstetrics and Gynecology | Admitting: Obstetrics and Gynecology

## 2012-10-13 ENCOUNTER — Inpatient Hospital Stay (HOSPITAL_COMMUNITY)
Admission: RE | Admit: 2012-10-13 | Discharge: 2012-10-19 | DRG: 765 | Disposition: A | Discharge status: Home / Self Care | Payer: Medicaid Other | Source: Ambulatory Visit | Attending: Obstetrics and Gynecology | Admitting: Obstetrics and Gynecology

## 2012-10-13 ENCOUNTER — Encounter (HOSPITAL_COMMUNITY): Payer: Self-pay | Admitting: *Deleted

## 2012-10-13 DIAGNOSIS — O26849 Uterine size-date discrepancy, unspecified trimester: Secondary | ICD-10-CM | POA: Diagnosis present

## 2012-10-13 DIAGNOSIS — Z22330 Carrier of Group B streptococcus: Secondary | ICD-10-CM

## 2012-10-13 DIAGNOSIS — D62 Acute posthemorrhagic anemia: Secondary | ICD-10-CM | POA: Diagnosis not present

## 2012-10-13 DIAGNOSIS — Z8632 Personal history of gestational diabetes: Secondary | ICD-10-CM | POA: Diagnosis present

## 2012-10-13 DIAGNOSIS — O99892 Other specified diseases and conditions complicating childbirth: Secondary | ICD-10-CM | POA: Diagnosis present

## 2012-10-13 DIAGNOSIS — O24419 Gestational diabetes mellitus in pregnancy, unspecified control: Secondary | ICD-10-CM | POA: Diagnosis present

## 2012-10-13 DIAGNOSIS — O3660X0 Maternal care for excessive fetal growth, unspecified trimester, not applicable or unspecified: Secondary | ICD-10-CM | POA: Diagnosis present

## 2012-10-13 DIAGNOSIS — Z98891 History of uterine scar from previous surgery: Secondary | ICD-10-CM

## 2012-10-13 DIAGNOSIS — O9903 Anemia complicating the puerperium: Secondary | ICD-10-CM | POA: Diagnosis not present

## 2012-10-13 DIAGNOSIS — O99814 Abnormal glucose complicating childbirth: Principal | ICD-10-CM | POA: Diagnosis present

## 2012-10-13 LAB — CBC
HCT: 32.7 % — ABNORMAL LOW (ref 36.0–46.0)
MCV: 77.5 fL — ABNORMAL LOW (ref 78.0–100.0)
Platelets: 153 10*3/uL (ref 150–400)
RBC: 4.22 MIL/uL (ref 3.87–5.11)
RDW: 15 % (ref 11.5–15.5)
WBC: 11.7 10*3/uL — ABNORMAL HIGH (ref 4.0–10.5)

## 2012-10-13 LAB — TYPE AND SCREEN
ABO/RH(D): O POS
Antibody Screen: NEGATIVE

## 2012-10-13 MED ORDER — ONDANSETRON HCL 4 MG/2ML IJ SOLN
4.0000 mg | Freq: Four times a day (QID) | INTRAMUSCULAR | Status: DC | PRN
  Administered 2012-10-15: 4 mg via INTRAVENOUS
  Filled 2012-10-13: qty 2

## 2012-10-13 MED ORDER — NALBUPHINE SYRINGE 5 MG/0.5 ML
10.0000 mg | INJECTION | INTRAMUSCULAR | Status: DC | PRN
  Administered 2012-10-13: 10 mg via INTRAVENOUS
  Filled 2012-10-13 (×2): qty 1

## 2012-10-13 MED ORDER — IBUPROFEN 600 MG PO TABS: 600.0000 mg | ORAL_TABLET | Freq: Four times a day (QID) | ORAL | Status: DC | PRN

## 2012-10-13 MED ORDER — PROMETHAZINE HCL 25 MG/ML IJ SOLN
25.0000 mg | Freq: Once | INTRAMUSCULAR | Status: AC
  Administered 2012-10-13: 25 mg via INTRAVENOUS
  Filled 2012-10-13: qty 1

## 2012-10-13 MED ORDER — CITRIC ACID-SODIUM CITRATE 334-500 MG/5ML PO SOLN
30.0000 mL | ORAL | Status: DC | PRN
  Administered 2012-10-16: 30 mL via ORAL
  Filled 2012-10-13: qty 15

## 2012-10-13 MED ORDER — OXYTOCIN BOLUS FROM INFUSION: 500.0000 mL | INTRAVENOUS | Status: DC

## 2012-10-13 MED ORDER — OXYTOCIN 40 UNITS IN LACTATED RINGERS INFUSION - SIMPLE MED: 62.5000 mL/h | INTRAVENOUS | Status: DC

## 2012-10-13 MED ORDER — PHENYLEPHRINE 40 MCG/ML (10ML) SYRINGE FOR IV PUSH (FOR BLOOD PRESSURE SUPPORT): 80.0000 ug | PREFILLED_SYRINGE | INTRAVENOUS | Status: DC | PRN

## 2012-10-13 MED ORDER — LACTATED RINGERS IV SOLN: 500.0000 mL | Freq: Once | INTRAVENOUS | Status: DC

## 2012-10-13 MED ORDER — TERBUTALINE SULFATE 1 MG/ML IJ SOLN: 0.2500 mg | Freq: Once | INTRAMUSCULAR | Status: AC | PRN

## 2012-10-13 MED ORDER — EPHEDRINE 5 MG/ML INJ: 10.0000 mg | INTRAVENOUS | Status: DC | PRN

## 2012-10-13 MED ORDER — DEXTROSE 5 % IV SOLN
2.5000 10*6.[IU] | INTRAVENOUS | Status: DC
  Administered 2012-10-13 – 2012-10-15 (×13): 2.5 10*6.[IU] via INTRAVENOUS
  Filled 2012-10-13 (×17): qty 2.5

## 2012-10-13 MED ORDER — LIDOCAINE HCL (PF) 1 % IJ SOLN: 30.0000 mL | INTRAMUSCULAR | Status: DC | PRN

## 2012-10-13 MED ORDER — FENTANYL 2.5 MCG/ML BUPIVACAINE 1/10 % EPIDURAL INFUSION (WH - ANES)
14.0000 mL/h | INTRAMUSCULAR | Status: DC | PRN
  Administered 2012-10-15 (×2): 14 mL/h via EPIDURAL
  Filled 2012-10-13 (×3): qty 125

## 2012-10-13 MED ORDER — PHENYLEPHRINE 40 MCG/ML (10ML) SYRINGE FOR IV PUSH (FOR BLOOD PRESSURE SUPPORT)
80.0000 ug | PREFILLED_SYRINGE | INTRAVENOUS | Status: DC | PRN
  Filled 2012-10-13 (×2): qty 5

## 2012-10-13 MED ORDER — PENICILLIN G POTASSIUM 5000000 UNITS IJ SOLR
5.0000 10*6.[IU] | Freq: Once | INTRAVENOUS | Status: AC
  Administered 2012-10-13: 5 10*6.[IU] via INTRAVENOUS
  Filled 2012-10-13: qty 5

## 2012-10-13 MED ORDER — LACTATED RINGERS IV SOLN
INTRAVENOUS | Status: DC
  Administered 2012-10-13 – 2012-10-14 (×2): via INTRAVENOUS
  Administered 2012-10-15: 125 mL/h via INTRAVENOUS
  Administered 2012-10-16 (×4): via INTRAVENOUS

## 2012-10-13 MED ORDER — LACTATED RINGERS IV SOLN
500.0000 mL | INTRAVENOUS | Status: DC | PRN
  Administered 2012-10-14: 500 mL via INTRAVENOUS

## 2012-10-13 MED ORDER — EPHEDRINE 5 MG/ML INJ
10.0000 mg | INTRAVENOUS | Status: DC | PRN
  Filled 2012-10-13 (×2): qty 4

## 2012-10-13 MED ORDER — OXYTOCIN 40 UNITS IN LACTATED RINGERS INFUSION - SIMPLE MED
1.0000 m[IU]/min | INTRAVENOUS | Status: DC
  Administered 2012-10-13: 1 m[IU]/min via INTRAVENOUS
  Filled 2012-10-13: qty 1000

## 2012-10-13 MED ORDER — OXYCODONE-ACETAMINOPHEN 5-325 MG PO TABS: 1.0000 | ORAL_TABLET | ORAL | Status: DC | PRN

## 2012-10-13 MED ORDER — DIPHENHYDRAMINE HCL 50 MG/ML IJ SOLN: 12.5000 mg | INTRAMUSCULAR | Status: DC | PRN

## 2012-10-13 MED ORDER — ACETAMINOPHEN 325 MG PO TABS: 650.0000 mg | ORAL_TABLET | ORAL | Status: DC | PRN

## 2012-10-13 NOTE — Progress Notes (Signed)
Patient ID: Tracey Alvarez, female   DOB: Oct 01, 1985, 27 y.o.   MRN: [id number] Tracey Alvarez is a 27 y.o. G1P0 at [redacted]w[redacted]d admitted for IOL   Subjective: C/o increased pain w ctx, requests pain meds, eventually planning epidural but not ready for it, multiple family members at [facility],   Objective: BP 115/60  Pulse 83  Temp(Src) 98.4 F (36.9 C) (Oral)  Resp 20  Ht 5\' 8"  (1.727 m)  Wt 280 lb (127.007 kg)  BMI 42.58 kg/m2  LMP 12/29/2011     FHT:  FHR: 140 bpm, variability: moderate,  accelerations:  Present,  decelerations:  Absent UC:   regular, every 4-5 minutes SVE:   Dilation: 2 Effacement (%): 80 Station: -1 Exam by:: S Krue Peterka CNM  Pitocin at 5mu   Assessment / Plan: IOL for GDM  Labor: early labor Preeclampsia:  no s/s Fetal Wellbeing:  Category I Pain Control:  IV meds Anticipated MOD:  NSVD  Will give nubain/phenergan IV now Enc pt to sleep Continue low dose pitocin titration  Recheck prn   Update physician PRN   Malissa Hippo 10/13/2012, 10:42 PM

## 2012-10-14 ENCOUNTER — Inpatient Hospital Stay (HOSPITAL_COMMUNITY): Payer: Managed Care, Other (non HMO) | Attending: Obstetrics and Gynecology

## 2012-10-14 LAB — ABO/RH: ABO/RH(D): O POS

## 2012-10-14 LAB — GLUCOSE, CAPILLARY
Glucose-Capillary: 104 mg/dL — ABNORMAL HIGH (ref 70–99)
Glucose-Capillary: 77 mg/dL (ref 70–99)
Glucose-Capillary: 70 mg/dL (ref 70–99)

## 2012-10-14 MED ORDER — PROMETHAZINE HCL 25 MG PO TABS: 25.0000 mg | ORAL_TABLET | Freq: Four times a day (QID) | ORAL | Status: DC | PRN

## 2012-10-14 MED ORDER — MISOPROSTOL 25 MCG QUARTER TABLET
25.0000 ug | ORAL_TABLET | ORAL | Status: DC | PRN
  Administered 2012-10-14 – 2012-10-15 (×2): 25 ug via VAGINAL
  Filled 2012-10-14 (×2): qty 0.25

## 2012-10-14 MED ORDER — TERBUTALINE SULFATE 1 MG/ML IJ SOLN: 0.2500 mg | Freq: Once | INTRAMUSCULAR | Status: AC | PRN

## 2012-10-14 MED ORDER — FENTANYL CITRATE 0.05 MG/ML IJ SOLN
100.0000 ug | INTRAMUSCULAR | Status: DC | PRN
  Administered 2012-10-15: 100 ug via INTRAVENOUS
  Filled 2012-10-14: qty 2

## 2012-10-14 MED ORDER — LACTATED RINGERS IV SOLN: 500.0000 mL | Freq: Once | INTRAVENOUS | Status: DC

## 2012-10-14 MED ORDER — OXYTOCIN 40 UNITS IN LACTATED RINGERS INFUSION - SIMPLE MED
1.0000 m[IU]/min | INTRAVENOUS | Status: DC
  Administered 2012-10-15: 2 m[IU]/min via INTRAVENOUS
  Filled 2012-10-14: qty 1000

## 2012-10-14 MED ORDER — ZOLPIDEM TARTRATE 5 MG PO TABS
5.0000 mg | ORAL_TABLET | Freq: Every evening | ORAL | Status: DC | PRN
  Administered 2012-10-15: 5 mg via ORAL
  Filled 2012-10-14: qty 1

## 2012-10-14 NOTE — Progress Notes (Signed)
  Subjective: Pt reported getting some rest last night.  C/o back pain, encouraged her to get up out of bed and sit in chair or on birth ball to help with back pain.  Offered pain med prn.  Objective: BP 119/92  Pulse 75  Temp(Src) 98.8 F (37.1 C) (Oral)  Resp 18  Ht 5\' 8"  (1.727 m)  Wt 280 lb (127.007 kg)  BMI 42.58 kg/m2  LMP 12/29/2011      FHT:  Cat I UC:   Irregular  SVE:   Dilation: 1.5 Effacement (%): 60 Station: -1 Exam by:: J Ameir Faria CNM Posterior  Assessment / Plan:  Labor: IOL for GDM; Pitocin at 8 milliU Preeclampsia: No s/s Fetal Wellbeing: Cat I Pain Control: Managing through mild UCs well without techniques I/D: GBS pos - on PCN prophylaxis; Intact Anticipated MOD: Continue Pitocin, consider AROM when appropriate; SVD   Victorine Mcnee 10/14/2012, 7:40 AM

## 2012-10-14 NOTE — Progress Notes (Signed)
Subjective: Pt received cytotec by RN at 2125.  After pitocin d/c'd this evening, pt ate and had shower.  No ctxs to report; denies LOF or VB.  GFM.  Pt's parents and s.o. Have been at bs.  Pt agreeable to take sleep aid tonight.    Objective: BP 123/53  Pulse 98  Temp(Src) 98.1 F (36.7 C) (Oral)  Resp 18  Ht 5\' 8"  (1.727 m)  Wt 280 lb (127.007 kg)  BMI 42.58 kg/m2  LMP 12/29/2011     .Marland Kitchen CBG (last 3)   Recent Labs  10/14/12 0343 10/14/12 0911 10/14/12 1350  GLUCAP 104* 77 70    FHT:  FHR: 150 bpm, variability: moderate,  accelerations:  Present,  decelerations:  Absent UC:   rare SVE:   Dilation: 1.5 Effacement (%): 60;70 Station: -1 Exam by:: H Corinda Ammon CN  Labs: Lab Results  Component Value Date   WBC 11.7* 10/13/2012   HGB 11.1* 10/13/2012   HCT 32.7* 10/13/2012   MCV 77.5* 10/13/2012   PLT 153 10/13/2012    Assessment / Plan: 1. [redacted]w[redacted]d 2. IOL for GDM-diet controlled   Labor: ripening for tonight s/p pitocin trial and no progress Preeclampsia:  no signs or symptoms of toxicity Fetal Wellbeing:  Category I Pain Control:  Labor support without medications I/D:  n/a Anticipated MOD:  NSVD 1. C/w MD prn  Jong Rickman H 10/14/2012, 9:43 PM

## 2012-10-14 NOTE — H&P (Signed)
Makenzie Weisner is a 27 y.o. female presenting for IOL. Pt denies ctx, vb or LOF, reports +FM.   HPI:  Maternal Medical History:  Reason for admission: IOL   Fetal activity: Perceived fetal activity is normal.   Last perceived fetal movement was within the past hour.    Prenatal complications: no prenatal complications Prenatal Complications - Diabetes: gestational. Diabetes is managed by diet.      OB History   Grav Para Term Preterm Abortions TAB SAB Ect Mult Living   1              Past Medical History  Diagnosis Date  . Pneumonia     AS TEEN  . Depression 2012    POST SURGERY; MEDS X 2 WEEKS  . Infection 2005    UIT  . Infection     YEAST X 1  . Anemia 2012  . Diabetes mellitus without complication   . Gestational diabetes     diet controlled   Past Surgical History  Procedure Laterality Date  . Vertical sleeve gasgtrectomy  2012  . Tonsillectomy  AGE 57   Family History: family history includes Anemia in her mother; Aneurysm in her maternal uncle; Arthritis in her maternal grandmother; Diabetes in her maternal aunt and maternal grandmother; Heart disease in her maternal grandfather; Hyperlipidemia in her maternal grandfather and maternal grandmother; Hypertension in her maternal grandmother; Lupus in her cousin and maternal aunt; Seizures in her brother; and Stroke in her maternal grandfather and maternal grandmother. Social History:  reports that she has never smoked. She has never used smokeless tobacco. She reports that  drinks alcohol. She reports that she does not use illicit drugs.   Prenatal Transfer Tool  Maternal Diabetes: Yes:  Diabetes Type:  Diet controlled Genetic Screening: Normal Maternal Ultrasounds/Referrals: Normal Fetal Ultrasounds or other Referrals:  None Maternal Substance Abuse:  No Significant Maternal Medications:  None Significant Maternal Lab Results:  Lab values include: Group B Strep negative Other Comments:  None  Review of  Systems  All other systems reviewed and are negative.    Dilation: 2 Effacement (%): 80 Station: -1 Exam by:: S Shelena Castelluccio CNM Blood pressure 112/66, pulse 87, temperature 98.8 F (37.1 C), temperature source Oral, resp. rate 20, height 5\' 8"  (1.727 m), weight 280 lb (127.007 kg), last menstrual period 12/29/2011. Maternal Exam:  Uterine Assessment: Contraction frequency is rare.   Abdomen: Patient reports no abdominal tenderness. Fundal height is LGA.   Estimated fetal weight is 8-9#.   Fetal presentation: vertex  Introitus: Normal vulva. Normal vagina.  Pelvis: adequate for delivery.   Cervix: Cervix evaluated by digital exam.     Fetal Exam Fetal Monitor Review: Mode: ultrasound.   Baseline rate: 140.  Variability: moderate (6-25 bpm).   Pattern: accelerations present and no decelerations.    Fetal State Assessment: Category I - tracings are normal.     Physical Exam  Nursing note and vitals reviewed. Constitutional: She is oriented to person, place, and time. She appears well-developed and well-nourished.  HENT:  Head: Normocephalic.  Eyes: Pupils are equal, round, and reactive to light.  Neck: Normal range of motion.  Cardiovascular: Normal rate, regular rhythm and normal heart sounds.   Respiratory: Effort normal and breath sounds normal.  GI: Soft. Bowel sounds are normal.  Genitourinary: Vagina normal.  Musculoskeletal: Normal range of motion.  Neurological: She is alert and oriented to person, place, and time. She has normal reflexes.  Skin: Skin is warm and  dry.  Psychiatric: She has a normal mood and affect. Her behavior is normal.    Prenatal labs: ABO, Rh: --/--/O POS, O POS (07/28 1720) Antibody: NEG (07/28 1720) Rubella: Immune (01/02 0000) RPR: NON REACTIVE (07/28 1720)  HBsAg: Negative (01/02 0000)  HIV: Non-reactive (01/02 0000)  GBS: Positive (07/22 0000)   Assessment/Plan: IUP at [redacted]w[redacted]d GBS neg FHR reassuring Favorable cervix GDM diet  controlled  Admit to b.s. Per c/w Dr Su Hilt Cbg's q4h, glucomander for cbg >120 Routine L&D orders Pitocin lose dose per protocol  Pain meds prn   Elyna Pangilinan M 10/14/2012, 3:44 AM

## 2012-10-14 NOTE — Progress Notes (Signed)
  Subjective: Pt is "stir crazy and hungry."  Pt feeling UCs but are reported mild.   Objective: BP 126/67  Pulse 70  Temp(Src) 98 F (36.7 C) (Oral)  Resp 20  Ht 5\' 8"  (1.727 m)  Wt 280 lb (127.007 kg)  BMI 42.58 kg/m2  LMP 12/29/2011      FHT:  Cat I UC:   regular, every 3-4 minutes, very mild  SVE:   Dilation: 1.5 Effacement (%): 60 Station: -1 Exam by:: J Athelene Hursey CNM  Assessment / Plan:  Labor: IOL; Pitocin 24 milliU Preeclampsia: no s/s Fetal Wellbeing: Cat I Pain Control: n/a I/D: GBS pos - on PCN prophylaxis; Intact   Anticipated MOD: Pitocin off, IA, eat/shower, plan to start cytotec tonight    Tracey Alvarez 10/14/2012, 6:15 PM

## 2012-10-15 ENCOUNTER — Encounter (HOSPITAL_COMMUNITY): Payer: Self-pay | Admitting: Anesthesiology

## 2012-10-15 ENCOUNTER — Inpatient Hospital Stay (HOSPITAL_COMMUNITY): Payer: Medicaid Other | Admitting: Anesthesiology

## 2012-10-15 LAB — GLUCOSE, CAPILLARY
Glucose-Capillary: 111 mg/dL — ABNORMAL HIGH (ref 70–99)
Glucose-Capillary: 81 mg/dL (ref 70–99)

## 2012-10-15 MED ORDER — BUPIVACAINE HCL (PF) 0.25 % IJ SOLN
INTRAMUSCULAR | Status: DC | PRN
  Administered 2012-10-15: 10 mL via EPIDURAL

## 2012-10-15 MED ORDER — LIDOCAINE HCL (PF) 1 % IJ SOLN
INTRAMUSCULAR | Status: DC | PRN
  Administered 2012-10-15 (×2): 4 mL

## 2012-10-15 MED ORDER — FENTANYL 2.5 MCG/ML BUPIVACAINE 1/10 % EPIDURAL INFUSION (WH - ANES)
INTRAMUSCULAR | Status: DC | PRN
  Administered 2012-10-15: 14 mL/h via EPIDURAL

## 2012-10-15 NOTE — Progress Notes (Signed)
  Subjective: Discussed with pt the R&B of AROM.  Pt verbalized understanding and wishes to proceed.  Objective: BP 171/78  Pulse 78  Temp(Src) 98.8 F (37.1 C) (Oral)  Resp 20  Ht 5\' 8"  (1.727 m)  Wt 280 lb (127.007 kg)  BMI 42.58 kg/m2  SpO2 100%  LMP 12/29/2011      FHT:  Cat II UC:   irregular, every 2-5 minutes  SVE:   3cm / 80 / -1  Assessment / Plan:  AROM with light mec  Labor: IOL; Pitocin 2 milliU Preeclampsia: no s/s  Fetal Wellbeing: Cat II Pain Control: n/a I/D: GBS pos - on PCN prophylaxis Anticipated MOD: SVD   Tracey Alvarez 10/15/2012, 1:37 PM

## 2012-10-15 NOTE — Progress Notes (Signed)
  Subjective: Pt reports feeling some pressure and requested to be checked.  Objective: BP 175/78  Pulse 73  Temp(Src) 99.1 F (37.3 C) (Oral)  Resp 20  Ht 5\' 8"  (1.727 m)  Wt 280 lb (127.007 kg)  BMI 42.58 kg/m2  SpO2 100%  LMP 12/29/2011      FHT:  Cat I UC:   regular, every 2-4 minutes  SVE:   Dilation: 6 Effacement (%): 100 Station: 0 Exam by:: Jurgen Groeneveld,CNM  Assessment / Plan:  Labor: IOL for GDM; active labor; Pitocin at 3 milliU Preeclampsia: No s/s Fetal Wellbeing:  Pain Control: Epidural I/D: GBS pos - on PCN prophylaxis; AROM at 0730  Anticipated MOD: SVD     Tracey Alvarez 10/15/2012, 4:35 PM

## 2012-10-15 NOTE — Progress Notes (Signed)
  Subjective: Pt is resting comfortably with epidural.  RN reported difficulty tracing UCs, with minimal variability.  Objective: BP 171/78  Pulse 78  Temp(Src) 98.8 F (37.1 C) (Oral)  Resp 20  Ht 5\' 8"  (1.727 m)  Wt 280 lb (127.007 kg)  BMI 42.58 kg/m2  SpO2 100%  LMP 12/29/2011      FHT:  Cat II UC:   Q 3 min  SVE:   Dilation: 4 Effacement (%): 100 Station: -1 Exam by:: sgrnc  Assessment / Plan:  Labor: IOL; Pitocin 3 milliU, AROM Preeclampsia: no s/s  Fetal Wellbeing: Cat II Pain Control: epidural I/D: GBS pos - on PCN prophylaxis; AROM at 0730 Anticipated MOD: SVD   Tracey Alvarez 10/15/2012, 1:31 PM

## 2012-10-15 NOTE — Progress Notes (Signed)
Subjective: Pt sleeping well when awoke for SVE.  Received 2 doses of cytotec overnight.  Rested w/ Ambien for sleep aid.  S.o. And parents remain asleep at bs.  Objective: BP 134/69  Pulse 83  Temp(Src) 98.8 F (37.1 C) (Oral)  Resp 18  Ht 5\' 8"  (1.727 m)  Wt 280 lb (127.007 kg)  BMI 42.58 kg/m2  SpO2 99%  LMP 12/29/2011     .Marland Kitchen CBG (last 3)   Recent Labs  10/14/12 1350 10/14/12 2208 10/15/12 0204  GLUCAP 70 118* 94    FHT:  FHR: 140 bpm, variability: moderate,  accelerations:  Present,  decelerations:  Absent UC:   occ'l SVE:   Dilation: 1.5 Effacement (%): 80 Station: -1 Exam by:: h Samariah Hokenson,cnm  Labs: Lab Results  Component Value Date   WBC 11.7* 10/13/2012   HGB 11.1* 10/13/2012   HCT 32.7* 10/13/2012   MCV 77.5* 10/13/2012   PLT 153 10/13/2012    Assessment / Plan: 1. [redacted]w[redacted]d 2. IOL for GDM-diet controlled 3. GBS pos  Labor: serial induction Preeclampsia:  no signs or symptoms of toxicity Fetal Wellbeing:  Category I Pain Control:  Labor support without medications I/D:  n/a Anticipated MOD:  NSVD 1. May have light laboring breakfast 2. Restart pitocin now; consider foley bulb in conjunction, prn. 3. C/w MD prn  Diontre Harps H 10/15/2012, 6:01 AM

## 2012-10-15 NOTE — Progress Notes (Signed)
Subjective: Pt has started having Left lower back pain and some abdominal pain over the last hr.  Has used PCAE 2x thus far; will call anesthesia for redose if current bolus doesn't help and repositioning.  Pitocin on 12mu.    Objective: BP 140/58  Pulse 80  Temp(Src) 98.6 F (37 C) (Axillary)  Resp 20  Ht 5\' 8"  (1.727 m)  Wt 280 lb (127.007 kg)  BMI 42.58 kg/m2  SpO2 100%  LMP 12/29/2011   Total I/O In: -  Out: 600 [Urine:600]  FHT:  FHR: 145 bpm, variability: moderate,  accelerations:  Present,  decelerations:  Absent UC:   irregular, every 2-4 minutes SVE:   Dilation: 8 Effacement (%): 100 Station: 0 Exam by:: Dhillon Comunale CNM Molding noted; good strength from ctx during exam on cx palpated Labs: Lab Results  Component Value Date   WBC 11.7* 10/13/2012   HGB 11.1* 10/13/2012   HCT 32.7* 10/13/2012   MCV 77.5* 10/13/2012   PLT 153 10/13/2012    Assessment / Plan: 1. [redacted]w[redacted]d 2. IOL for GDM 3. GBS pos   Labor: protracted active phase Preeclampsia:  no signs or symptoms of toxicity Fetal Wellbeing:  Category I Pain Control:  Epidural I/D:  n/a Anticipated MOD:  c/s vs nsvd 1. Some descent since last SVE, but no cervical change.  MVU's inconsistent (115-180).  Offered pt options of expectant management and recheck cervix in 1-2 hrs, or proceed with c/s since no cervical change in last  3 hrs.  R/b/a rev'd.  Pt desires expectant management. 2. Dr. Richardson Dopp updated on pt's status.  Tracey Alvarez H 10/15/2012, 11:41 PM

## 2012-10-15 NOTE — Anesthesia Preprocedure Evaluation (Addendum)
Anesthesia Evaluation  Patient identified by MRN, date of birth, ID band Patient awake    Reviewed: Allergy & Precautions, H&P , Patient's Chart, lab work & pertinent test results  Airway Mallampati: III TM Distance: >3 FB Neck ROM: Full    Dental no notable dental hx. (+) Teeth Intact   Pulmonary neg pulmonary ROS, pneumonia -, resolved,  breath sounds clear to auscultation  Pulmonary exam normal       Cardiovascular negative cardio ROS  Rhythm:Regular Rate:Normal     Neuro/Psych PSYCHIATRIC DISORDERS Depression negative neurological ROS     GI/Hepatic Neg liver ROS, GERD-  Medicated and Controlled,S/P Gastric Bypass   Endo/Other  diabetes, Well Controlled, GestationalMorbid obesity  Renal/GU negative Renal ROS  negative genitourinary   Musculoskeletal negative musculoskeletal ROS (+)   Abdominal (+) + obese,   Peds  Hematology negative hematology ROS (+)   Anesthesia Other Findings   Reproductive/Obstetrics (+) Pregnancy                          Anesthesia Physical Anesthesia Plan  ASA: III  Anesthesia Plan: Epidural   Post-op Pain Management:    Induction:   Airway Management Planned: Natural Airway  Additional Equipment:   Intra-op Plan:   Post-operative Plan:   Informed Consent: I have reviewed the patients History and Physical, chart, labs and discussed the procedure including the risks, benefits and alternatives for the proposed anesthesia with the patient or authorized representative who has indicated his/her understanding and acceptance.   Dental advisory given  Plan Discussed with: Anesthesiologist  Anesthesia Plan Comments:         Anesthesia Quick Evaluation

## 2012-10-15 NOTE — Anesthesia Procedure Notes (Signed)
Epidural Patient location during procedure: OB Start time: 10/15/2012 10:43 AM  Staffing Anesthesiologist: Lavone Barrientes A. Performed by: anesthesiologist   Preanesthetic Checklist Completed: patient identified, site marked, surgical consent, pre-op evaluation, timeout performed, IV checked, risks and benefits discussed and monitors and equipment checked  Epidural Patient position: sitting Prep: site prepped and draped and DuraPrep Patient monitoring: continuous pulse ox and blood pressure Approach: midline Injection technique: LOR air  Needle:  Needle type: Tuohy  Needle gauge: 17 G Needle length: 9 cm and 9 Needle insertion depth: 8 cm Catheter type: closed end flexible Catheter size: 19 Gauge Catheter at skin depth: 14 cm Test dose: negative and Other  Assessment Events: blood not aspirated, injection not painful, no injection resistance, negative IV test and no paresthesia  Additional Notes Patient identified. Risks and benefits discussed including failed block, incomplete  Pain control, post dural puncture headache, nerve damage, paralysis, blood pressure Changes, nausea, vomiting, reactions to medications-both toxic and allergic and post Partum back pain. All questions were answered. Patient expressed understanding and wished to proceed. Sterile technique was used throughout procedure. Epidural site was Dressed with sterile barrier dressing. No paresthesias, signs of intravascular injection Or signs of intrathecal spread were encountered.  Patient was more comfortable after the epidural was dosed. Please see RN's note for documentation of vital signs and FHR which are stable.

## 2012-10-15 NOTE — Progress Notes (Signed)
Subjective: Pt is w/o pain.  No other complaints.  IUPC and FSE out/dislodged from earlier today.  No PIH s/s.  BP's higher earlier when cuff on pt's ankle (for comfort); returned to pt's arm and normal now.  Pitocin has not been increased in some time; on 6mu currently.  Objective: BP 141/77  Pulse 84  Temp(Src) 99.3 F (37.4 C) (Oral)  Resp 20  Ht 5\' 8"  (1.727 m)  Wt 280 lb (127.007 kg)  BMI 42.58 kg/m2  SpO2 100%  LMP 12/29/2011     .Marland Kitchen CBG (last 3)   Recent Labs  10/15/12 1218 10/15/12 1459 10/15/12 1923  GLUCAP 111* 81 72  .Marland Kitchen Filed Vitals:   10/15/12 1911 10/15/12 1918 10/15/12 1922 10/15/12 1930  BP: 163/94 138/49 135/76 141/77  Pulse: 103 89 89 84  Temp:   99.3 F (37.4 C)   TempSrc:   Oral   Resp:   20   Height:      Weight:      SpO2:        FHT:  FHR: 150 bpm, variability: moderate,  accelerations:  Present,  decelerations:  Absent UC:   regular, every 2-3 minutes SVE:   Dilation: 8 Effacement (%): 100 Station: 0 Exam by:: H Caymen Dubray CNM IUPC replaced w/o difficulty; MVU's appear adequate Labs: Lab Results  Component Value Date   WBC 11.7* 10/13/2012   HGB 11.1* 10/13/2012   HCT 32.7* 10/13/2012   MCV 77.5* 10/13/2012   PLT 153 10/13/2012    Assessment / Plan: 1. [redacted]w[redacted]d 2. IOL for GDM-diet controlled 3. GBS pos  Labor: transition Preeclampsia:  no signs or symptoms of toxicity Fetal Wellbeing:  Category I Pain Control:  Epidural I/D:  n/a Anticipated MOD:  NSVD 1. Will increase pitocin since hasn't been titrated in several hours.  CTO closely for labor progression secondary to GDM and possible LGA fetus. 2. C/w MD prn  Tracey Alvarez H 10/15/2012, 7:45 PM

## 2012-10-16 ENCOUNTER — Encounter (HOSPITAL_COMMUNITY): Payer: Self-pay | Admitting: Anesthesiology

## 2012-10-16 ENCOUNTER — Encounter (HOSPITAL_COMMUNITY)
Admission: RE | Disposition: A | Discharge status: Home / Self Care | Payer: Self-pay | Source: Ambulatory Visit | Attending: Obstetrics and Gynecology

## 2012-10-16 DIAGNOSIS — Z98891 History of uterine scar from previous surgery: Secondary | ICD-10-CM

## 2012-10-16 LAB — CBC
Hemoglobin: 9.3 g/dL — ABNORMAL LOW (ref 12.0–15.0)
MCH: 25.4 pg — ABNORMAL LOW (ref 26.0–34.0)
MCHC: 33.2 g/dL (ref 30.0–36.0)
MCV: 76.5 fL — ABNORMAL LOW (ref 78.0–100.0)
RBC: 3.66 MIL/uL — ABNORMAL LOW (ref 3.87–5.11)

## 2012-10-16 SURGERY — Surgical Case
Anesthesia: Epidural | Site: Abdomen | Wound class: Clean Contaminated

## 2012-10-16 MED ORDER — MEPERIDINE HCL 25 MG/ML IJ SOLN
INTRAMUSCULAR | Status: DC | PRN
  Administered 2012-10-16 (×2): 12.5 mg via INTRAVENOUS

## 2012-10-16 MED ORDER — SIMETHICONE 80 MG PO CHEW: 80.0000 mg | CHEWABLE_TABLET | ORAL | Status: DC | PRN

## 2012-10-16 MED ORDER — OXYTOCIN 40 UNITS IN LACTATED RINGERS INFUSION - SIMPLE MED: 62.5000 mL/h | INTRAVENOUS | Status: AC

## 2012-10-16 MED ORDER — LACTATED RINGERS IV SOLN
INTRAVENOUS | Status: DC
  Administered 2012-10-16: 12:00:00 via INTRAVENOUS

## 2012-10-16 MED ORDER — ONDANSETRON HCL 4 MG/2ML IJ SOLN
4.0000 mg | INTRAMUSCULAR | Status: DC | PRN
  Administered 2012-10-16: 4 mg via INTRAVENOUS

## 2012-10-16 MED ORDER — SCOPOLAMINE 1 MG/3DAYS TD PT72
MEDICATED_PATCH | TRANSDERMAL | Status: AC
  Filled 2012-10-16: qty 1

## 2012-10-16 MED ORDER — ONDANSETRON HCL 4 MG/2ML IJ SOLN
INTRAMUSCULAR | Status: AC
  Filled 2012-10-16: qty 2

## 2012-10-16 MED ORDER — OXYTOCIN 10 UNIT/ML IJ SOLN
INTRAMUSCULAR | Status: AC
  Filled 2012-10-16: qty 4

## 2012-10-16 MED ORDER — MIDAZOLAM HCL 2 MG/2ML IJ SOLN: 0.5000 mg | Freq: Once | INTRAMUSCULAR | Status: DC | PRN

## 2012-10-16 MED ORDER — OXYTOCIN 10 UNIT/ML IJ SOLN
40.0000 [IU] | INTRAVENOUS | Status: DC | PRN
  Administered 2012-10-16: 40 [IU] via INTRAVENOUS

## 2012-10-16 MED ORDER — FENTANYL CITRATE 0.05 MG/ML IJ SOLN
INTRAMUSCULAR | Status: AC
  Filled 2012-10-16: qty 2

## 2012-10-16 MED ORDER — DIPHENHYDRAMINE HCL 50 MG/ML IJ SOLN: 25.0000 mg | INTRAMUSCULAR | Status: DC | PRN

## 2012-10-16 MED ORDER — 0.9 % SODIUM CHLORIDE (POUR BTL) OPTIME
TOPICAL | Status: DC | PRN
  Administered 2012-10-16 (×2): 1000 mL

## 2012-10-16 MED ORDER — IBUPROFEN 600 MG PO TABS
600.0000 mg | ORAL_TABLET | Freq: Four times a day (QID) | ORAL | Status: DC
  Administered 2012-10-16 – 2012-10-17 (×5): 600 mg via ORAL
  Filled 2012-10-16 (×5): qty 1

## 2012-10-16 MED ORDER — NALBUPHINE HCL 10 MG/ML IJ SOLN
5.0000 mg | INTRAMUSCULAR | Status: DC | PRN
  Administered 2012-10-16: 5 mg via INTRAVENOUS
  Filled 2012-10-16: qty 1

## 2012-10-16 MED ORDER — MORPHINE SULFATE (PF) 0.5 MG/ML IJ SOLN
INTRAMUSCULAR | Status: DC | PRN
  Administered 2012-10-16: 1500 ug via INTRAVENOUS
  Administered 2012-10-16: 3500 ug via EPIDURAL

## 2012-10-16 MED ORDER — SIMETHICONE 80 MG PO CHEW
80.0000 mg | CHEWABLE_TABLET | Freq: Three times a day (TID) | ORAL | Status: DC
  Administered 2012-10-16 – 2012-10-19 (×11): 80 mg via ORAL

## 2012-10-16 MED ORDER — MORPHINE SULFATE 0.5 MG/ML IJ SOLN
INTRAMUSCULAR | Status: AC
  Filled 2012-10-16: qty 10

## 2012-10-16 MED ORDER — DIBUCAINE 1 % RE OINT: 1.0000 "application " | TOPICAL_OINTMENT | RECTAL | Status: DC | PRN

## 2012-10-16 MED ORDER — SENNOSIDES-DOCUSATE SODIUM 8.6-50 MG PO TABS
2.0000 | ORAL_TABLET | Freq: Every day | ORAL | Status: DC
  Administered 2012-10-17 – 2012-10-19 (×2): 2 via ORAL

## 2012-10-16 MED ORDER — SODIUM CHLORIDE 0.9 % IJ SOLN: 3.0000 mL | INTRAMUSCULAR | Status: DC | PRN

## 2012-10-16 MED ORDER — LANOLIN HYDROUS EX OINT: 1.0000 "application " | TOPICAL_OINTMENT | CUTANEOUS | Status: DC | PRN

## 2012-10-16 MED ORDER — MEPERIDINE HCL 25 MG/ML IJ SOLN
INTRAMUSCULAR | Status: AC
  Filled 2012-10-16: qty 1

## 2012-10-16 MED ORDER — ONDANSETRON HCL 4 MG PO TABS: 4.0000 mg | ORAL_TABLET | ORAL | Status: DC | PRN

## 2012-10-16 MED ORDER — DEXTROSE 5 % IV SOLN
3.0000 g | Freq: Once | INTRAVENOUS | Status: AC
  Administered 2012-10-16: 3 g via INTRAVENOUS
  Filled 2012-10-16: qty 3000

## 2012-10-16 MED ORDER — KETOROLAC TROMETHAMINE 30 MG/ML IJ SOLN
INTRAMUSCULAR | Status: AC
  Filled 2012-10-16: qty 1

## 2012-10-16 MED ORDER — PRENATAL MULTIVITAMIN CH
1.0000 | ORAL_TABLET | Freq: Every day | ORAL | Status: DC
  Administered 2012-10-16 – 2012-10-18 (×3): 1 via ORAL
  Filled 2012-10-16 (×3): qty 1

## 2012-10-16 MED ORDER — FERROUS SULFATE 325 (65 FE) MG PO TABS
325.0000 mg | ORAL_TABLET | Freq: Two times a day (BID) | ORAL | Status: DC
  Administered 2012-10-16 – 2012-10-19 (×7): 325 mg via ORAL
  Filled 2012-10-16 (×7): qty 1

## 2012-10-16 MED ORDER — MEPERIDINE HCL 25 MG/ML IJ SOLN: 6.2500 mg | INTRAMUSCULAR | Status: DC | PRN

## 2012-10-16 MED ORDER — SODIUM BICARBONATE 8.4 % IV SOLN
INTRAVENOUS | Status: AC
  Filled 2012-10-16: qty 50

## 2012-10-16 MED ORDER — KETOROLAC TROMETHAMINE 30 MG/ML IJ SOLN
30.0000 mg | Freq: Four times a day (QID) | INTRAMUSCULAR | Status: AC | PRN
  Administered 2012-10-16: 30 mg via INTRAVENOUS

## 2012-10-16 MED ORDER — ZOLPIDEM TARTRATE 5 MG PO TABS: 5.0000 mg | ORAL_TABLET | Freq: Every evening | ORAL | Status: DC | PRN

## 2012-10-16 MED ORDER — DIPHENHYDRAMINE HCL 25 MG PO CAPS: 25.0000 mg | ORAL_CAPSULE | Freq: Four times a day (QID) | ORAL | Status: DC | PRN

## 2012-10-16 MED ORDER — NALOXONE HCL 1 MG/ML IJ SOLN
1.0000 ug/kg/h | INTRAVENOUS | Status: DC | PRN
  Filled 2012-10-16: qty 2

## 2012-10-16 MED ORDER — NALOXONE HCL 0.4 MG/ML IJ SOLN: 0.4000 mg | INTRAMUSCULAR | Status: DC | PRN

## 2012-10-16 MED ORDER — DIPHENHYDRAMINE HCL 25 MG PO CAPS
25.0000 mg | ORAL_CAPSULE | ORAL | Status: DC | PRN
  Filled 2012-10-16: qty 1

## 2012-10-16 MED ORDER — OXYCODONE-ACETAMINOPHEN 5-325 MG PO TABS
1.0000 | ORAL_TABLET | ORAL | Status: DC | PRN
  Administered 2012-10-17: 2 via ORAL
  Administered 2012-10-17: 1 via ORAL
  Filled 2012-10-16: qty 2
  Filled 2012-10-16: qty 1

## 2012-10-16 MED ORDER — WITCH HAZEL-GLYCERIN EX PADS: 1.0000 "application " | MEDICATED_PAD | CUTANEOUS | Status: DC | PRN

## 2012-10-16 MED ORDER — SODIUM BICARBONATE 8.4 % IV SOLN
INTRAVENOUS | Status: DC | PRN
  Administered 2012-10-16: 10 mL via EPIDURAL

## 2012-10-16 MED ORDER — DIPHENHYDRAMINE HCL 50 MG/ML IJ SOLN: 12.5000 mg | INTRAMUSCULAR | Status: DC | PRN

## 2012-10-16 MED ORDER — KETOROLAC TROMETHAMINE 30 MG/ML IJ SOLN: 30.0000 mg | Freq: Four times a day (QID) | INTRAMUSCULAR | Status: AC | PRN

## 2012-10-16 MED ORDER — NALBUPHINE HCL 10 MG/ML IJ SOLN
5.0000 mg | INTRAMUSCULAR | Status: DC | PRN
  Filled 2012-10-16 (×2): qty 1

## 2012-10-16 MED ORDER — LIDOCAINE HCL (CARDIAC) 20 MG/ML IV SOLN
INTRAVENOUS | Status: AC
  Filled 2012-10-16: qty 5

## 2012-10-16 MED ORDER — METOCLOPRAMIDE HCL 5 MG/ML IJ SOLN: 10.0000 mg | Freq: Three times a day (TID) | INTRAMUSCULAR | Status: DC | PRN

## 2012-10-16 MED ORDER — FENTANYL CITRATE 0.05 MG/ML IJ SOLN
25.0000 ug | INTRAMUSCULAR | Status: DC | PRN
  Administered 2012-10-16 (×2): 50 ug via INTRAVENOUS

## 2012-10-16 MED ORDER — SCOPOLAMINE 1 MG/3DAYS TD PT72
1.0000 | MEDICATED_PATCH | Freq: Once | TRANSDERMAL | Status: AC
  Administered 2012-10-16: 1.5 mg via TRANSDERMAL

## 2012-10-16 MED ORDER — ONDANSETRON HCL 4 MG/2ML IJ SOLN
INTRAMUSCULAR | Status: DC | PRN
  Administered 2012-10-16: 4 mg via INTRAVENOUS

## 2012-10-16 MED ORDER — ONDANSETRON HCL 4 MG/2ML IJ SOLN
4.0000 mg | Freq: Three times a day (TID) | INTRAMUSCULAR | Status: DC | PRN
  Filled 2012-10-16: qty 2

## 2012-10-16 MED ORDER — PROMETHAZINE HCL 25 MG/ML IJ SOLN: 6.2500 mg | INTRAMUSCULAR | Status: DC | PRN

## 2012-10-16 MED ORDER — MENTHOL 3 MG MT LOZG: 1.0000 | LOZENGE | OROMUCOSAL | Status: DC | PRN

## 2012-10-16 SURGICAL SUPPLY — 42 items
APL SKNCLS STERI-STRIP NONHPOA (GAUZE/BANDAGES/DRESSINGS) ×2
BARRIER ADHS 3X4 INTERCEED (GAUZE/BANDAGES/DRESSINGS) ×1 IMPLANT
BENZOIN TINCTURE PRP APPL 2/3 (GAUZE/BANDAGES/DRESSINGS) ×3 IMPLANT
BRR ADH 4X3 ABS CNTRL BYND (GAUZE/BANDAGES/DRESSINGS) ×1
CHLORAPREP W/TINT 26ML (MISCELLANEOUS) ×1 IMPLANT
CLAMP CORD UMBIL (MISCELLANEOUS) ×1 IMPLANT
CLOTH BEACON ORANGE TIMEOUT ST (SAFETY) ×2 IMPLANT
CONTAINER PREFILL 10% NBF 15ML (MISCELLANEOUS) IMPLANT
DRAPE LG THREE QUARTER DISP (DRAPES) ×2 IMPLANT
DRSG OPSITE POSTOP 4X10 (GAUZE/BANDAGES/DRESSINGS) ×2 IMPLANT
DURAPREP 26ML APPLICATOR (WOUND CARE) ×1 IMPLANT
ELECT REM PT RETURN 9FT ADLT (ELECTROSURGICAL) ×2
ELECTRODE REM PT RTRN 9FT ADLT (ELECTROSURGICAL) ×1 IMPLANT
EXTRACTOR VACUUM M CUP 4 TUBE (SUCTIONS) IMPLANT
GLOVE BIOGEL M 6.5 STRL (GLOVE) ×4 IMPLANT
GLOVE BIOGEL PI IND STRL 6.5 (GLOVE) ×1 IMPLANT
GLOVE BIOGEL PI INDICATOR 6.5 (GLOVE) ×1
GOWN PREVENTION PLUS XLARGE (GOWN DISPOSABLE) ×2 IMPLANT
GOWN STRL REIN XL XLG (GOWN DISPOSABLE) ×4 IMPLANT
KIT ABG SYR 3ML LUER SLIP (SYRINGE) IMPLANT
NDL HYPO 25X5/8 SAFETYGLIDE (NEEDLE) IMPLANT
NEEDLE HYPO 25X5/8 SAFETYGLIDE (NEEDLE) IMPLANT
NS IRRIG 1000ML POUR BTL (IV SOLUTION) ×3 IMPLANT
PACK C SECTION WH (CUSTOM PROCEDURE TRAY) ×2 IMPLANT
PAD OB MATERNITY 4.3X12.25 (PERSONAL CARE ITEMS) ×2 IMPLANT
RTRCTR C-SECT PINK 25CM LRG (MISCELLANEOUS) ×1 IMPLANT
RTRCTR C-SECT PINK 34CM XLRG (MISCELLANEOUS) IMPLANT
SPONGE LAP 18X18 X RAY DECT (DISPOSABLE) ×1 IMPLANT
STAPLER VISISTAT 35W (STAPLE) IMPLANT
STRIP CLOSURE SKIN 1/2X4 (GAUZE/BANDAGES/DRESSINGS) ×2 IMPLANT
SUT PDS AB 0 CT1 27 (SUTURE) ×4 IMPLANT
SUT PLAIN 0 NONE (SUTURE) IMPLANT
SUT VIC AB 0 CTX 36 (SUTURE) ×10
SUT VIC AB 0 CTX36XBRD ANBCTRL (SUTURE) ×3 IMPLANT
SUT VIC AB 2-0 CT1 27 (SUTURE) ×4
SUT VIC AB 2-0 CT1 TAPERPNT 27 (SUTURE) ×1 IMPLANT
SUT VIC AB 3-0 SH 27 (SUTURE) ×2
SUT VIC AB 3-0 SH 27X BRD (SUTURE) IMPLANT
SUT VIC AB 4-0 KS 27 (SUTURE) ×2 IMPLANT
TOWEL OR 17X24 6PK STRL BLUE (TOWEL DISPOSABLE) ×6 IMPLANT
TRAY FOLEY CATH 14FR (SET/KITS/TRAYS/PACK) ×1 IMPLANT
WATER STERILE IRR 1000ML POUR (IV SOLUTION) IMPLANT

## 2012-10-16 NOTE — Anesthesia Postprocedure Evaluation (Signed)
  Anesthesia Post Note  Patient: Tracey Alvarez  Procedure(s) Performed: Procedure(s) (LRB): Primary Cesarean Section Delivery Baby Girl @ [phone], Apgars 8/9 (N/A)  Anesthesia type: Epidural  Patient location: PACU  Post pain: Pain level controlled  Post assessment: Post-op Vital signs reviewed  Last Vitals:  Filed Vitals:   10/16/12 0500  BP: 133/63  Pulse: 93  Temp:   Resp: 24    Post vital signs: Reviewed  Level of consciousness: awake  Complications: No apparent anesthesia complications

## 2012-10-16 NOTE — OR Nursing (Signed)
Approximately 10 ml's expressed on Uterine massage.   Uterus firm 1 below Uterus.

## 2012-10-16 NOTE — Progress Notes (Signed)
Subjective: Pitocin currently on 16mu.  Pt had initial relief after epidural redosed, but again w/ mild breakthrough pain in back and abdomen.  Family remains at bs.    Objective: BP 128/72  Pulse 99  Temp(Src) 98.3 F (36.8 C) (Axillary)  Resp 20  Ht 5\' 8"  (1.727 m)  Wt 280 lb (127.007 kg)  BMI 42.58 kg/m2  SpO2 100%  LMP 12/29/2011   Total I/O In: -  Out: 1400 [Urine:1400] .Marland Kitchen CBG (last 3)   Recent Labs  10/15/12 1923 10/15/12 2306 10/16/12 0205  GLUCAP 72 71 92    FHT:  FHR: 145 bpm, variability: moderate,  accelerations:  Present,  decelerations:  Absent UC:   regular, every 1.5-3 minutes SVE:   8-9/100/0  Cx more on pt's right than left MVU's 180-215 Labs: Lab Results  Component Value Date   WBC 11.7* 10/13/2012   HGB 11.1* 10/13/2012   HCT 32.7* 10/13/2012   MCV 77.5* 10/13/2012   PLT 153 10/13/2012    Assessment / Plan: 1. [redacted]w[redacted]d 2. IOL for GDM 3. GBS pos  Labor: arrest in dilatation/protracted active phase Preeclampsia:  no signs or symptoms of toxicity Fetal Wellbeing:  Category I Pain Control:  Epidural I/D:  n/a Anticipated MOD:  in light of adequate MVU's and no cervical change, c/s recommended for FTP.  r/b/a rev'd with pt, including but no limited to risk of bleeding, infection, damage to surrounding organs, and anesthesia complications.  pt verbalized understanding of risks following discussion and agreeable to proceed with c/s for delivery.  Dr. Richardson Dopp updated and en route.  Keelen Quevedo H 10/16/2012, 2:15 AM

## 2012-10-16 NOTE — Progress Notes (Signed)
UR chart review completed.  

## 2012-10-16 NOTE — Anesthesia Postprocedure Evaluation (Signed)
Anesthesia Post Note  Patient: Tracey Alvarez  Procedure(s) Performed: Procedure(s) (LRB): Primary Cesarean Section Delivery Baby Girl @ [phone], Apgars 8/9 (N/A)  Anesthesia type: Epidural  Patient location: Mother/Baby  Post pain: Pain level controlled  Post assessment: Post-op Vital signs reviewed  Last Vitals:  Filed Vitals:   10/16/12 0633  BP: 124/74  Pulse: 91  Temp: 36.8 C  Resp: 20    Post vital signs: Reviewed  Level of consciousness:alert  Complications: No apparent anesthesia complications Anesthesia Post-op Note  Patient: Tracey Alvarez  Procedure(s) Performed: Procedure(s): Primary Cesarean Section Delivery Baby Girl @ [phone], Apgars 8/9 (N/A)  Patient Location: PACU and Mother/Baby  Anesthesia Type:Epidural  Level of Consciousness: awake, alert , oriented and patient cooperative  Airway and Oxygen Therapy: Patient Spontanous Breathing  Post-op Pain: none  Post-op Assessment: Post-op Vital signs reviewed, No signs of Nausea or vomiting, Adequate PO intake, Pain level controlled, No headache, No backache, No residual numbness and No residual motor weakness  Post-op Vital Signs: Reviewed and stable  Complications: No apparent anesthesia complications

## 2012-10-16 NOTE — Op Note (Signed)
Cesarean Section Procedure Note  Indications: failure to progress: arrest of dilation  Pre-operative Diagnosis: 39 week 4 day pregnancy.  Post-operative Diagnosis: same  Surgeon: Jessee Avers.   Assistants: Robinette Haines CNM  Anesthesia: Epidural anesthesia  ASA Class: 2   Procedure Details   The patient was seen in the Holding Room. The risks, benefits, complications, treatment options, and expected outcomes were discussed with the patient.  The patient concurred with the proposed plan, giving informed consent.  The site of surgery properly noted/marked. The patient was taken to Operating Room # 9, identified as Tracey Alvarez and the procedure verified as C-Section Delivery. A Time Out was held and the above information confirmed.  After induction of anesthesia, the patient was draped and prepped in the usual sterile manner. A Pfannenstiel incision was made and carried down through the subcutaneous tissue to the fascia. Fascial incision was made and extended transversely. The fascia was separated from the underlying rectus tissue superiorly and inferiorly. The peritoneum was identified and entered. Peritoneal incision was extended longitudinally. The utero-vesical peritoneal reflection was incised transversely and the bladder flap was bluntly freed from the lower uterine segment. A low transverse uterine incision was made. Delivered from cephalic presentation was a  Female with Apgar scores of 9 at one minute and 9 at five minutes. After the umbilical cord was clamped and cut cord blood was obtained for evaluation. The placenta was removed intact and appeared normal. The uterine outline, tubes and ovaries appeared normal. The uterine incision was closed with running locked sutures of  0 vicryl. A second layer of 0 vicryl was used to imbricate the incision. Pt had bleeding from the mid portion of the incision this was controlled with a figur of eight stitch of 0 vicryl.  Hemostasis  was observed. Lavage was carried out until clear. The peritoneum was reapproximated with 2-0 vicryl.  The fascia was then reapproximated with running sutures of 0 pds. The skin was reapproximated with 4-0 vicryl .  Instrument, sponge, and needle counts were correct prior the abdominal closure and at the conclusion of the case.   Findings: Female infant in cephalic presentation.. Normal fallopian tubes and ovaries.   Estimated Blood Loss:  800 ml          Drains: foley catheter          Total IV Fluids:  2000 ml         Specimens: Placenta and sent to labor and delivery            Implants: None         Complications:  None; patient tolerated the procedure well.         Disposition: PACU - hemodynamically stable.         Condition: stable  Attending Attestation: I performed the procedure.

## 2012-10-16 NOTE — Progress Notes (Signed)
Tracey Alvarez is a 27 y.o. G1P0 at [redacted]w[redacted]d by LMP admitted for induction of labor due to Gestational diabetes.  Subjective:  Called to see patient by Midwife Tracey Alvarez due to failure to progress. MVU s have been adequate over the last 2 hours. Pt comfortable with epidural . + FM    Objective: BP 149/76  Pulse 95  Temp(Src) 98.3 F (36.8 C) (Axillary)  Resp 18  Ht 5\' 8"  (1.727 m)  Wt 127.007 kg (280 lb)  BMI 42.58 kg/m2  SpO2 100%  LMP 12/29/2011   Total I/O In: -  Out: 1400 [Urine:1400]  FHT:  FHR: 140 bpm, variability: moderate,  accelerations:  Present,  decelerations:  Absent UC:   regular, every 2 minutes SVE:   Dilation: 8 (unchanged) Effacement (%): 100 Station: 0 Exam by:: Tracey Alvarez CNM  Cervical exam 8/100/0  Labs: Lab Results  Component Value Date   WBC 11.7* 10/13/2012   HGB 11.1* 10/13/2012   HCT 32.7* 10/13/2012   MCV 77.5* 10/13/2012   PLT 153 10/13/2012    Assessment / Plan: Arrest in active phase of labor  Labor: plan for cesarean section due to failure to progress... r/b/a of cesarean section discussed with the patient including but not limited to infection/ bleeding/ damge to bowel bladder baby with the need for further surgery. r/o transfusion HIV/ Hep B&C discussed.  pt voiced understanding and desires to proceed.  Preeclampsia:  no signs or symptoms of toxicity Fetal Wellbeing:  Category I Pain Control:  Epidural I/D:  ANCEF 3 grams IV OCTOR  Anticipated MOD:   cesarean section   Tracey Alvarez J. 10/16/2012, 2:44 AM

## 2012-10-16 NOTE — Anesthesia Postprocedure Evaluation (Signed)
  Anesthesia Post-op Note  Anesthesia Post Note  Patient: Tracey Alvarez  Procedure(s) Performed: Procedure(s) (LRB): Primary Cesarean Section Delivery Baby Girl @ [phone], Apgars 8/9 (N/A)  Anesthesia type: Epidural  Patient location: Mother/Baby  Post pain: Pain level controlled  Post assessment: Post-op Vital signs reviewed  Last Vitals:  Filed Vitals:   10/16/12 0833  BP: 128/80  Pulse: 99  Temp: 36.7 C  Resp: 17    Post vital signs: Reviewed  Level of consciousness:alert  Complications: No apparent anesthesia complications

## 2012-10-16 NOTE — Transfer of Care (Signed)
Immediate Anesthesia Transfer of Care Note  Patient: Tracey Alvarez  Procedure(s) Performed: Procedure(s): Primary Cesarean Section Delivery Baby Girl @ [phone], Apgars 8/9 (N/A)  Patient Location: PACU  Anesthesia Type:Epidural  Level of Consciousness: awake, alert  and oriented  Airway & Oxygen Therapy: Patient Spontanous Breathing  Post-op Assessment: Report given to PACU RN and Post -op Vital signs reviewed and stable  Post vital signs: Reviewed and stable  Complications: No apparent anesthesia complications

## 2012-10-17 ENCOUNTER — Encounter (HOSPITAL_COMMUNITY): Payer: Self-pay | Admitting: Obstetrics and Gynecology

## 2012-10-17 MED ORDER — ACETAMINOPHEN 500 MG PO TABS
1000.0000 mg | ORAL_TABLET | Freq: Once | ORAL | Status: AC
  Administered 2012-10-17: 1000 mg via ORAL
  Filled 2012-10-17: qty 2

## 2012-10-17 MED ORDER — PNEUMOCOCCAL VAC POLYVALENT 25 MCG/0.5ML IJ INJ
0.5000 mL | INJECTION | INTRAMUSCULAR | Status: AC
  Filled 2012-10-17: qty 0.5

## 2012-10-17 MED ORDER — HYDROCODONE-ACETAMINOPHEN 5-325 MG PO TABS
1.0000 | ORAL_TABLET | ORAL | Status: DC | PRN
  Administered 2012-10-17: 2 via ORAL
  Filled 2012-10-17: qty 2

## 2012-10-17 MED ORDER — IBUPROFEN 600 MG PO TABS
600.0000 mg | ORAL_TABLET | Freq: Four times a day (QID) | ORAL | Status: DC | PRN
  Administered 2012-10-17 – 2012-10-19 (×5): 600 mg via ORAL
  Filled 2012-10-17 (×5): qty 1

## 2012-10-17 MED ORDER — ACETAMINOPHEN 325 MG PO TABS: 650.0000 mg | ORAL_TABLET | Freq: Four times a day (QID) | ORAL | Status: DC | PRN

## 2012-10-17 MED ORDER — HYDROMORPHONE HCL 2 MG PO TABS
2.0000 mg | ORAL_TABLET | ORAL | Status: DC | PRN
  Administered 2012-10-17 – 2012-10-18 (×3): 2 mg via ORAL
  Filled 2012-10-17 (×4): qty 1

## 2012-10-17 MED ORDER — TETANUS-DIPHTH-ACELL PERTUSSIS 5-2.5-18.5 LF-MCG/0.5 IM SUSP
0.5000 mL | Freq: Once | INTRAMUSCULAR | Status: AC
  Administered 2012-10-17: 0.5 mL via INTRAMUSCULAR

## 2012-10-17 MED ORDER — KETOROLAC TROMETHAMINE 30 MG/ML IJ SOLN: 30.0000 mg | Freq: Four times a day (QID) | INTRAMUSCULAR | Status: DC | PRN

## 2012-10-17 NOTE — Progress Notes (Signed)
Pt c/o shivers and feeling feverish.  Vital signs taken and temp-orally 100.2 with heart rate 109.  H. Steelman notified

## 2012-10-17 NOTE — Progress Notes (Signed)
Subjective: Postpartum Day 1: Cesarean Delivery due to FTP after attempted IOL for GDM Patient up ad lib, reports no syncope or dizziness.  C/o incisional pain and reports the percocet is not relieving the pain well.  Discussed trying Vicodin. Feeding:  Breastfeeding Contraceptive plan:  Considering Mirena  Objective: Vital signs in last 24 hours: Temp:  [97.5 F (36.4 C)-98.7 F (37.1 C)] 98 F (36.7 C) (08/01 0604) Pulse Rate:  [89-114] 96 (08/01 0604) Resp:  [17-20] 18 (08/01 0604) BP: (100-128)/(60-87) 118/60 mmHg (08/01 0604) SpO2:  [97 %-98 %] 98 % (07/31 2325)  Physical Exam:  General: alert, cooperative and no distress Lochia: appropriate Uterine Fundus: firm Incision: healing well DVT Evaluation: No evidence of DVT seen on physical exam. Negative Homan's sign. JP drain:   n/a   Recent Labs  10/16/12 0800  HGB 9.3*  HCT 28.0*    Assessment/Plan: Status post Cesarean section day 1. Doing well postoperatively.  Continue current care. Plan for discharge tomorrow or Sunday    Haroldine Laws 10/17/2012, 10:18 AM

## 2012-10-18 DIAGNOSIS — D62 Acute posthemorrhagic anemia: Secondary | ICD-10-CM | POA: Diagnosis not present

## 2012-10-18 LAB — CBC WITH DIFFERENTIAL/PLATELET
Eosinophils Absolute: 0.2 10*3/uL (ref 0.0–0.7)
Eosinophils Relative: 2 % (ref 0–5)
HCT: 23.3 % — ABNORMAL LOW (ref 36.0–46.0)
Lymphocytes Relative: 17 % (ref 12–46)
Lymphs Abs: 1.9 10*3/uL (ref 0.7–4.0)
MCH: 25.7 pg — ABNORMAL LOW (ref 26.0–34.0)
MCV: 76.9 fL — ABNORMAL LOW (ref 78.0–100.0)
Monocytes Absolute: 0.9 10*3/uL (ref 0.1–1.0)
Monocytes Relative: 8 % (ref 3–12)
RBC: 3.03 MIL/uL — ABNORMAL LOW (ref 3.87–5.11)
WBC: 11.6 10*3/uL — ABNORMAL HIGH (ref 4.0–10.5)

## 2012-10-18 MED ORDER — TRAMADOL HCL 50 MG PO TABS
100.0000 mg | ORAL_TABLET | ORAL | Status: DC | PRN
  Administered 2012-10-18: 50 mg via ORAL
  Administered 2012-10-18 – 2012-10-19 (×6): 100 mg via ORAL
  Filled 2012-10-18: qty 1
  Filled 2012-10-18 (×6): qty 2

## 2012-10-18 NOTE — Progress Notes (Signed)
Patient ID: Tracey Alvarez, female   DOB: [birth year], 27 y.o.   MRN: [id number] Subjective: Postpartum Day 2: Cesarean Delivery secondary to: FTP Standing at [facility], talking to pediatrician when I entered, FOB supportive at [facility], as well.  Patient reports incisional pain, tolerating PO, + flatus and no problems voiding.   Per nursing pt has not been out of bed much up ad lib without syncope,  Reports that she didn't like how dilaudid felt, requesting something else,  Took 1 tab ultram this AM, states that has helped some,  Discussed normal course of healing and expectations for pain mgmnt  BF well, some latch issues but is working w lactation  Mood stable, bonding well, appropriate interaction w baby Contraception: considering mirena, discussed r/b/s   Objective: Vital signs in last 24 hours: Temp:  [98.1 F (36.7 C)-100.2 F (37.9 C)] 98.7 F (37.1 C) (08/02 0505) Pulse Rate:  [101-109] 101 (08/02 0505) Resp:  [18] 18 (08/02 0505) BP: (100-126)/(67-78) 112/71 mmHg (08/02 0505) Temp max in 24hr 100.2 last evening, normal this AM  Orthostatic vitals stable   Physical Exam:  General: alert and no distress Heart: RRR Lungs: CTAB Abdomen: BS x4 Uterine Fundus: firm Incision: honeycomb dressing CDI   Lochia: appropriate DVT Evaluation: No evidence of DVT seen on physical exam. Negative Homan's sign. No significant calf/ankle edema.   Recent Labs  10/16/12 0800 10/18/12 0630  HGB 9.3* 7.8*  HCT 28.0* 23.3*    Assessment/Plan: Status post Cesarean section. Doing well postoperatively.  Anemia without symptoms, will order FE supplement BID, pt declined blood tx at this time  Will continue with tramadol 50-100mg  PO every 4 hours PRN and Change motrin to 600mg  PO q6h ATC instead of PRN Enc pt frequent ambulation, enc PO hydration Continue current care. Discharge home tmrw    Charliene Inoue M 10/18/2012, 10:25 AM

## 2012-10-19 MED ORDER — FERROUS SULFATE 325 (65 FE) MG PO TABS: 325.0000 mg | ORAL_TABLET | Freq: Two times a day (BID) | ORAL | Status: DC

## 2012-10-19 MED ORDER — IBUPROFEN 600 MG PO TABS: 600.0000 mg | ORAL_TABLET | Freq: Four times a day (QID) | ORAL | Status: DC | PRN

## 2012-10-19 MED ORDER — TRAMADOL HCL 50 MG PO TABS: 100.0000 mg | ORAL_TABLET | ORAL | Status: DC | PRN

## 2012-10-19 NOTE — Discharge Summary (Signed)
Cesarean Section Delivery Discharge Summary  Tracey Alvarez  DOB:    09-Oct-1985 MRN:    [id number] CSN:    [id number]  Date of admission:                  10/13/12  Date of discharge:                   10/19/12  Procedures this admission: C/S for FTP after IOL for GDM  Date of Delivery: 10/16/12 by Dr. Richardson Dopp  Newborn Data:  Live born female  Birth Weight: 8 lb 4.5 oz (3755 g) APGAR: 8, 9  Home with mother. Name: "Tracey Alvarez" Circumcision Plan: n/a  History of Present Illness:  Ms. Tracey Alvarez is a 27 y.o. female, G1P1001, who presents at [redacted]w[redacted]d weeks gestation. The patient has been followed at the [facility] and Gynecology division of [facility].    Her pregnancy has been complicated by:  Patient Active Problem List   Diagnosis Date Noted  . Postoperative anemia due to acute blood loss 10/18/2012  . S/P cesarean section 10/16/2012  . Gestational diabetes 10/09/2012  . Increased BMI 08/11/2012  . Uterine size date discrepancy 08/11/2012  . Hx of laparoscopic partial gastrectomy 08/11/2012     Hospital course:  The patient was admitted for IOL for GDM.   Her postpartum course was not complicated except for asymptomatic anemia, and difficult to control incisional pain.  She was discharged to home on postpartum day 3 doing well.  Feeding:  bottle  Contraception:  IUD  Discharge hemoglobin:  HGB  Date Value Range Status  08/04/2009 11.3* 11.6 - 15.9 g/dL Final     Hemoglobin  Date Value Range Status  10/18/2012 7.8* 12.0 - 15.0 g/dL Final  06/20/100 72.5   Final     HCT  Date Value Range Status  10/18/2012 23.3* 36.0 - 46.0 % Final  03/20/2012 34   Final  08/04/2009 33.9* 34.8 - 46.6 % Final    Discharge Physical Exam:   General: alert, cooperative and no distress Lochia: appropriate Uterine Fundus: firm Incision: healing well DVT Evaluation: No evidence of DVT seen on physical exam. Negative Homan's  sign.  Intrapartum Procedures: cesarean: low cervical, transverse and GBS prophylaxis Postpartum Procedures: none Complications-Operative and Postpartum: none  Discharge Diagnoses: Term Pregnancy-delivered and Failed induction  Discharge Information:  Activity:           pelvic rest Diet:                routine Medications: PNV, Ibuprofen, Iron and Tramadol Condition:      stable Instructions:  refer to practice specific booklet Discharge to: home  Follow-up Information   Follow up with [facility]. Schedule an appointment as soon as possible for a visit in 5 weeks. (Call with any questions or concerns)    Contact information:   [address] [place] [address] [phone]       Haroldine Laws 10/19/2012

## 2012-10-20 ENCOUNTER — Encounter (HOSPITAL_COMMUNITY): Payer: Self-pay | Admitting: *Deleted

## 2012-10-20 ENCOUNTER — Inpatient Hospital Stay (HOSPITAL_COMMUNITY)
Admission: AD | Admit: 2012-10-20 | Discharge: 2012-10-20 | Disposition: A | Discharge status: Home / Self Care | Payer: Medicaid Other | Source: Ambulatory Visit | Attending: Obstetrics and Gynecology | Admitting: Obstetrics and Gynecology

## 2012-10-20 ENCOUNTER — Inpatient Hospital Stay (HOSPITAL_COMMUNITY): Payer: Medicaid Other

## 2012-10-20 DIAGNOSIS — D649 Anemia, unspecified: Secondary | ICD-10-CM | POA: Insufficient documentation

## 2012-10-20 DIAGNOSIS — IMO0002 Reserved for concepts with insufficient information to code with codable children: Secondary | ICD-10-CM | POA: Insufficient documentation

## 2012-10-20 DIAGNOSIS — M79609 Pain in unspecified limb: Secondary | ICD-10-CM | POA: Insufficient documentation

## 2012-10-20 DIAGNOSIS — R7402 Elevation of levels of lactic acid dehydrogenase (LDH): Secondary | ICD-10-CM | POA: Insufficient documentation

## 2012-10-20 DIAGNOSIS — R7401 Elevation of levels of liver transaminase levels: Secondary | ICD-10-CM | POA: Insufficient documentation

## 2012-10-20 DIAGNOSIS — O9081 Anemia of the puerperium: Secondary | ICD-10-CM | POA: Insufficient documentation

## 2012-10-20 DIAGNOSIS — R059 Cough, unspecified: Secondary | ICD-10-CM | POA: Insufficient documentation

## 2012-10-20 DIAGNOSIS — R0789 Other chest pain: Secondary | ICD-10-CM | POA: Insufficient documentation

## 2012-10-20 DIAGNOSIS — R05 Cough: Secondary | ICD-10-CM | POA: Insufficient documentation

## 2012-10-20 DIAGNOSIS — O99893 Other specified diseases and conditions complicating puerperium: Secondary | ICD-10-CM | POA: Insufficient documentation

## 2012-10-20 DIAGNOSIS — R0602 Shortness of breath: Secondary | ICD-10-CM | POA: Insufficient documentation

## 2012-10-20 DIAGNOSIS — R51 Headache: Secondary | ICD-10-CM | POA: Insufficient documentation

## 2012-10-20 HISTORY — DX: Polycystic ovarian syndrome: E28.2

## 2012-10-20 LAB — CBC WITH DIFFERENTIAL/PLATELET
Basophils Absolute: 0 10*3/uL (ref 0.0–0.1)
Eosinophils Absolute: 0.2 10*3/uL (ref 0.0–0.7)
Eosinophils Relative: 3 % (ref 0–5)
HCT: 24.1 % — ABNORMAL LOW (ref 36.0–46.0)
Lymphocytes Relative: 20 % (ref 12–46)
MCH: 25.6 pg — ABNORMAL LOW (ref 26.0–34.0)
MCHC: 33.2 g/dL (ref 30.0–36.0)
MCV: 77.2 fL — ABNORMAL LOW (ref 78.0–100.0)
Monocytes Absolute: 0.6 10*3/uL (ref 0.1–1.0)
RDW: 14.7 % (ref 11.5–15.5)

## 2012-10-20 LAB — URIC ACID: Uric Acid, Serum: 8.8 mg/dL — ABNORMAL HIGH (ref 2.4–7.0)

## 2012-10-20 LAB — PROTEIN / CREATININE RATIO, URINE: Creatinine, Urine: 92.87 mg/dL

## 2012-10-20 LAB — COMPREHENSIVE METABOLIC PANEL
AST: 28 U/L (ref 0–37)
CO2: 23 mEq/L (ref 19–32)
Calcium: 8.8 mg/dL (ref 8.4–10.5)
Creatinine, Ser: 0.85 mg/dL (ref 0.50–1.10)
GFR calc Af Amer: >90 mL/min (ref >90)
GFR calc non Af Amer: >90 mL/min (ref >90)
Total Protein: 6.2 g/dL (ref 6.0–8.3)

## 2012-10-20 NOTE — MAU Provider Note (Signed)
History     CSN: [id number]  Arrival date and time: 10/20/12 1803   First Provider Initiated Contact with Patient 10/20/12 2037      Chief Complaint  Patient presents with  . Leg Swelling  . Leg Pain  . Shortness of Breath  . Wheezing   HPI Comments: Pt is a G1P1 5 days s/p primary c/s for FTP, w cc of leg swelling, and SOB when supine. She also reports a mild headache, denies any visual disturbances. Denies any RUQ pain. Pt was discharged home on Sunday 8/3, and noticed late on Sunday evening she had pedal and LEE, she admitted to having increased her activity while at home, pt was instructed to lay down with legs elevated and increase water intake, she states when laying down, she was then feeling SOB, and had increased abdominal pain and chest pressure. Pt also reports having some nausea today but no vomiting. Admits to decreased appetite, and reports drinking about 48oz of water. Pt is breastfeeding, but having difficulty w latch and reports nipple soreness, has supplemented some w formula.      Past Medical History  Diagnosis Date  . Pneumonia     AS TEEN  . Infection 2005    UIT  . Infection     YEAST X 1  . Anemia 2012  . Diabetes mellitus without complication   . Gestational diabetes     diet controlled  . Depression 2012    POST we loss SURGERY; MEDS X 2 WEEKS  . PCOS (polycystic ovarian syndrome)     Past Surgical History  Procedure Laterality Date  . Vertical sleeve gasgtrectomy  2012  . Tonsillectomy  AGE 52  . Cesarean section N/A 10/16/2012    Procedure: Primary Cesarean Section Delivery Baby Girl @ 0328, Apgars 8/9;  Surgeon: Dorien Chihuahua. Richardson Dopp, MD;  Location: [facility];  Service: Obstetrics;  Laterality: N/A;    Family History  Problem Relation Age of Onset  . Anemia Mother   . Seizures Brother     CHILDHOOD  . Diabetes Maternal Aunt   . Lupus Maternal Aunt   . Aneurysm Maternal Uncle   . Arthritis Maternal Grandmother   . Diabetes Maternal Grandmother   .  Hyperlipidemia Maternal Grandmother   . Hypertension Maternal Grandmother   . Stroke Maternal Grandmother   . Heart disease Maternal Grandfather   . Hyperlipidemia Maternal Grandfather   . Stroke Maternal Grandfather   . Lupus Cousin     History  Substance Use Topics  . Smoking status: Never Smoker   . Smokeless tobacco: Never Used  . Alcohol Use: Yes     Comment: RARELY    Allergies:  Allergies  Allergen Reactions  . Shellfish Allergy Shortness Of Breath    Prescriptions prior to admission  Medication Sig Dispense Refill  . acetaminophen (TYLENOL) 500 MG tablet Take 500 mg by mouth every 6 (six) hours as needed for pain.      . ferrous sulfate 325 (65 FE) MG tablet Take 1 tablet (325 mg total) by mouth 2 (two) times daily with a meal.  60 tablet  3  . ibuprofen (ADVIL,MOTRIN) 600 MG tablet Take 1 tablet (600 mg total) by mouth every 6 (six) hours as needed for pain.  30 tablet  0  . Prenatal Vit-Fe Fumarate-FA (PRENATAL MULTIVITAMIN) TABS Take 1 tablet by mouth daily at 12 noon.      . traMADol (ULTRAM) 50 MG tablet Take 2 tablets (100 mg total) by  mouth every 4 (four) hours as needed.  30 tablet  0    Review of Systems  Constitutional: Negative for fever.  HENT: Positive for congestion. Negative for sore throat.   Eyes: Negative for blurred vision and double vision.  Respiratory: Positive for cough and shortness of breath. Negative for sputum production and wheezing.   Cardiovascular: Positive for chest pain and leg swelling. Negative for palpitations.       Pressure when lying supine   Gastrointestinal: Positive for nausea. Negative for heartburn, vomiting, abdominal pain, diarrhea and constipation.  Genitourinary: Negative for dysuria.  Musculoskeletal: Negative for myalgias.  Neurological: Positive for headaches. Negative for dizziness.  Psychiatric/Behavioral: Negative for depression.  All other systems reviewed and are negative.   Physical Exam   Blood pressure  138/67, pulse 88, temperature 98.5 F (36.9 C), temperature source Oral, resp. rate 20, SpO2 100.00%.  Physical Exam  Nursing note and vitals reviewed. Constitutional: She is oriented to person, place, and time. She appears well-developed and well-nourished. No distress.  HENT:  Head: Normocephalic.  Eyes: Pupils are equal, round, and reactive to light.  Neck: Normal range of motion.  Cardiovascular: Normal rate, regular rhythm and normal heart sounds.   Respiratory: Effort normal. She has no wheezes. She exhibits no tenderness.  Diminished breath sounds in lower lobes bilaterally   GI: Soft. Bowel sounds are normal. She exhibits no distension. There is no tenderness.  Genitourinary:  Deferred   Musculoskeletal: Normal range of motion. She exhibits edema. She exhibits no tenderness.  Mild 1+ non-pitting pedal edema bilaterally   Neurological: She is alert and oriented to person, place, and time. She has normal reflexes.  Neg clonus, 2+ DTR's   Skin: Skin is warm and dry.  Psychiatric: She has a normal mood and affect. Her behavior is normal.    MAU Course  Procedures    Assessment and Plan  S/p primary c/s 5 days ago for FTP R/o pre-eclampsia  Anemia  PCR pending Mildly elevated ALT BP's improving  D/w Dr Raymondo Band 10/20/2012, 8:43 PM

## 2012-10-20 NOTE — MAU Note (Signed)
Swelling started while in hospital, has been increasing since d/c.  When lays down and elevates feet- she starts wheezing and coughing.

## 2012-10-20 NOTE — MAU Note (Signed)
Patient states she delivered by cesarean section on 7-31, was discharged yesterday. Patient states she had increased swelling in the feet and legs. Was instructed to elevate her legs and when she does this she starts to wheez, but the swelling goes down. When she puts her feet down the wheezing stops and the swelling increases. Now having leg pain, shortness of breath at times and some coughing. Has headaches off and on. Chest pressure.

## 2012-10-26 NOTE — Addendum Note (Signed)
Addendum created 10/26/12 1013 by Eusebio Friendly., MD   Modules edited: Anesthesia Events

## 2012-10-29 NOTE — MAU Provider Note (Signed)
Pt f/u in office within 24 hours.

## 2013-01-22 ENCOUNTER — Other Ambulatory Visit: Payer: Self-pay

## 2014-01-18 ENCOUNTER — Encounter (HOSPITAL_COMMUNITY): Payer: Self-pay | Admitting: *Deleted

## 2017-10-22 ENCOUNTER — Encounter: Payer: Self-pay | Admitting: Obstetrics and Gynecology

## 2017-10-22 LAB — OB RESULTS CONSOLE ANTIBODY SCREEN: Antibody Screen: NEGATIVE

## 2017-10-22 LAB — OB RESULTS CONSOLE GC/CHLAMYDIA
Chlamydia: NEGATIVE
GC PROBE AMP, GENITAL: NEGATIVE

## 2017-10-22 LAB — OB RESULTS CONSOLE RPR: RPR: NONREACTIVE

## 2017-10-22 LAB — OB RESULTS CONSOLE HEPATITIS B SURFACE ANTIGEN: Hepatitis B Surface Ag: NEGATIVE

## 2017-10-22 LAB — OB RESULTS CONSOLE HIV ANTIBODY (ROUTINE TESTING): HIV: NONREACTIVE

## 2017-10-22 LAB — OB RESULTS CONSOLE RUBELLA ANTIBODY, IGM: Rubella: NON-IMMUNE/NOT IMMUNE

## 2017-10-22 LAB — OB RESULTS CONSOLE ABO/RH: RH Type: POSITIVE

## 2017-11-16 ENCOUNTER — Other Ambulatory Visit: Payer: Self-pay

## 2017-11-16 ENCOUNTER — Observation Stay (HOSPITAL_COMMUNITY)
Admission: AD | Admit: 2017-11-16 | Discharge: 2017-11-17 | Disposition: A | Discharge status: Home / Self Care | Payer: BLUE CROSS/BLUE SHIELD | Source: Ambulatory Visit | Attending: Obstetrics & Gynecology | Admitting: Obstetrics & Gynecology

## 2017-11-16 ENCOUNTER — Encounter (HOSPITAL_COMMUNITY): Payer: Self-pay | Admitting: *Deleted

## 2017-11-16 DIAGNOSIS — R748 Abnormal levels of other serum enzymes: Secondary | ICD-10-CM | POA: Diagnosis not present

## 2017-11-16 DIAGNOSIS — O99212 Obesity complicating pregnancy, second trimester: Secondary | ICD-10-CM | POA: Diagnosis not present

## 2017-11-16 DIAGNOSIS — R1084 Generalized abdominal pain: Secondary | ICD-10-CM | POA: Diagnosis not present

## 2017-11-16 DIAGNOSIS — O10112 Pre-existing hypertensive heart disease complicating pregnancy, second trimester: Secondary | ICD-10-CM | POA: Diagnosis not present

## 2017-11-16 DIAGNOSIS — O10012 Pre-existing essential hypertension complicating pregnancy, second trimester: Secondary | ICD-10-CM | POA: Diagnosis not present

## 2017-11-16 DIAGNOSIS — Z3A14 14 weeks gestation of pregnancy: Secondary | ICD-10-CM | POA: Diagnosis not present

## 2017-11-16 DIAGNOSIS — O99019 Anemia complicating pregnancy, unspecified trimester: Secondary | ICD-10-CM | POA: Diagnosis not present

## 2017-11-16 DIAGNOSIS — O161 Unspecified maternal hypertension, first trimester: Secondary | ICD-10-CM

## 2017-11-16 DIAGNOSIS — I509 Heart failure, unspecified: Secondary | ICD-10-CM | POA: Insufficient documentation

## 2017-11-16 DIAGNOSIS — I110 Hypertensive heart disease with heart failure: Secondary | ICD-10-CM | POA: Insufficient documentation

## 2017-11-16 DIAGNOSIS — R7309 Other abnormal glucose: Secondary | ICD-10-CM

## 2017-11-16 DIAGNOSIS — E669 Obesity, unspecified: Secondary | ICD-10-CM | POA: Insufficient documentation

## 2017-11-16 DIAGNOSIS — O10919 Unspecified pre-existing hypertension complicating pregnancy, unspecified trimester: Secondary | ICD-10-CM | POA: Diagnosis present

## 2017-11-16 DIAGNOSIS — O9989 Other specified diseases and conditions complicating pregnancy, childbirth and the puerperium: Secondary | ICD-10-CM | POA: Diagnosis present

## 2017-11-16 LAB — URINALYSIS, ROUTINE W REFLEX MICROSCOPIC
BILIRUBIN URINE: NEGATIVE
GLUCOSE, UA: NEGATIVE mg/dL
HGB URINE DIPSTICK: NEGATIVE
Ketones, ur: 80 mg/dL — AB
Leukocytes, UA: NEGATIVE
Nitrite: NEGATIVE
Protein, ur: NEGATIVE mg/dL
SPECIFIC GRAVITY, URINE: 1.013 (ref 1.005–1.030)
pH: 6 (ref 5.0–8.0)

## 2017-11-16 LAB — CBC
HCT: 29.4 % — ABNORMAL LOW (ref 36.0–46.0)
HEMOGLOBIN: 9.5 g/dL — AB (ref 12.0–15.0)
MCH: 24.4 pg — AB (ref 26.0–34.0)
MCHC: 32.3 g/dL (ref 30.0–36.0)
MCV: 75.4 fL — AB (ref 78.0–100.0)
Platelets: 159 10*3/uL (ref 150–400)
RBC: 3.9 MIL/uL (ref 3.87–5.11)
RDW: 15.8 % — ABNORMAL HIGH (ref 11.5–15.5)
WBC: 9.9 10*3/uL (ref 4.0–10.5)

## 2017-11-16 LAB — PROTEIN / CREATININE RATIO, URINE
CREATININE, URINE: 146 mg/dL
PROTEIN CREATININE RATIO: 0.08 mg/mg{creat} (ref 0.00–0.15)
TOTAL PROTEIN, URINE: 11 mg/dL

## 2017-11-16 LAB — COMPREHENSIVE METABOLIC PANEL
ALK PHOS: 34 U/L — AB (ref 38–126)
ALT: 23 U/L (ref 0–44)
ANION GAP: 11 (ref 5–15)
AST: 20 U/L (ref 15–41)
Albumin: 3.3 g/dL — ABNORMAL LOW (ref 3.5–5.0)
BILIRUBIN TOTAL: 0.7 mg/dL (ref 0.3–1.2)
BUN: 10 mg/dL (ref 6–20)
CALCIUM: 8.7 mg/dL — AB (ref 8.9–10.3)
CO2: 19 mmol/L — ABNORMAL LOW (ref 22–32)
CREATININE: 0.65 mg/dL (ref 0.44–1.00)
Chloride: 104 mmol/L (ref 98–111)
Glucose, Bld: 86 mg/dL (ref 70–99)
Potassium: 3.4 mmol/L — ABNORMAL LOW (ref 3.5–5.1)
Sodium: 134 mmol/L — ABNORMAL LOW (ref 135–145)
TOTAL PROTEIN: 7 g/dL (ref 6.5–8.1)

## 2017-11-16 MED ORDER — NIFEDIPINE ER OSMOTIC RELEASE 30 MG PO TB24
30.0000 mg | ORAL_TABLET | Freq: Every day | ORAL | Status: DC
  Administered 2017-11-16: 30 mg via ORAL
  Filled 2017-11-16: qty 1

## 2017-11-16 MED ORDER — CALCIUM CARBONATE ANTACID 500 MG PO CHEW: 2.0000 | CHEWABLE_TABLET | ORAL | Status: DC | PRN

## 2017-11-16 MED ORDER — NIFEDIPINE ER OSMOTIC RELEASE 30 MG PO TB24
60.0000 mg | ORAL_TABLET | Freq: Once | ORAL | Status: AC
  Administered 2017-11-16: 60 mg via ORAL
  Filled 2017-11-16: qty 2

## 2017-11-16 MED ORDER — ZOLPIDEM TARTRATE 5 MG PO TABS: 5.0000 mg | ORAL_TABLET | Freq: Every evening | ORAL | Status: DC | PRN

## 2017-11-16 MED ORDER — HYDRALAZINE HCL 20 MG/ML IJ SOLN
10.0000 mg | INTRAMUSCULAR | Status: DC | PRN
  Administered 2017-11-16: 10 mg via INTRAVENOUS
  Filled 2017-11-16: qty 1

## 2017-11-16 MED ORDER — LABETALOL HCL 5 MG/ML IV SOLN
20.0000 mg | INTRAVENOUS | Status: DC | PRN
  Administered 2017-11-16: 20 mg via INTRAVENOUS
  Filled 2017-11-16: qty 4

## 2017-11-16 MED ORDER — PRENATAL MULTIVITAMIN CH
1.0000 | ORAL_TABLET | Freq: Every day | ORAL | Status: DC
  Administered 2017-11-16 – 2017-11-17 (×2): 1 via ORAL
  Filled 2017-11-16 (×3): qty 1

## 2017-11-16 MED ORDER — ACETAMINOPHEN 325 MG PO TABS
650.0000 mg | ORAL_TABLET | ORAL | Status: DC | PRN
  Administered 2017-11-16: 650 mg via ORAL
  Filled 2017-11-16: qty 2

## 2017-11-16 MED ORDER — DOCUSATE SODIUM 100 MG PO CAPS
100.0000 mg | ORAL_CAPSULE | Freq: Every day | ORAL | Status: DC
  Administered 2017-11-16 – 2017-11-17 (×2): 100 mg via ORAL
  Filled 2017-11-16 (×3): qty 1

## 2017-11-16 MED ORDER — LABETALOL HCL 5 MG/ML IV SOLN
40.0000 mg | INTRAVENOUS | Status: DC | PRN
  Administered 2017-11-16: 40 mg via INTRAVENOUS
  Filled 2017-11-16: qty 8

## 2017-11-16 MED ORDER — LABETALOL HCL 5 MG/ML IV SOLN
80.0000 mg | INTRAVENOUS | Status: DC | PRN
  Administered 2017-11-16: 80 mg via INTRAVENOUS
  Filled 2017-11-16: qty 16

## 2017-11-16 NOTE — Progress Notes (Signed)
Pt to 319 via w/c

## 2017-11-16 NOTE — Progress Notes (Signed)
Ranee Gosselin CNM notified of pt's b/p of 167/90. Will give Labetalol 80mg  IVP

## 2017-11-16 NOTE — MAU Note (Addendum)
Dr Alwyn Pea diagnosed me with HTN. Was told to come in if B/P too high. Took B/P with wrist cuff and was 202/93. Took B/P medicine after b/p reading. Took Labetalol 200mg  about 2100. Some lower abd cramping. Denies vag bleeding. Had h/a and blurry vision earlier but not now.

## 2017-11-16 NOTE — Progress Notes (Signed)
Tracey Alvarez is a 32 y.o. G2P1001 at [redacted]w[redacted]d   Subjective: Pt without complaints. No headache.  Objective: BP (!) 142/91 (BP Location: Right Arm)   Pulse 89   Temp 98.4 F (36.9 C) (Oral)   Resp 16   Ht 5\' 8"  (1.727 m)   Wt (!) 137.9 kg   SpO2 99%   BMI 46.22 kg/m  No intake/output data recorded. No intake/output data recorded.  FHT: 164 by doppler  Labs: Lab Results  Component Value Date   WBC 9.9 11/16/2017   HGB 9.5 (L) 11/16/2017   HCT 29.4 (L) 11/16/2017   MCV 75.4 (L) 11/16/2017   PLT 159 11/16/2017    Assessment / Plan: IUP @ 14 1/7 weeks  Chronic HTN- uncontrolled with Labetalol 200 mg at home. S/p IV Labetalol within the last 12 hours. Now on Procardia XL 30 mg.  Increase Procardia prn until BP 140-150s/80s-90s x ~ 24 hours.  Thurnell Lose 11/16/2017, 8:05 AM

## 2017-11-16 NOTE — H&P (Signed)
Tracey Alvarez is a 32 y.o. female presenting for chronic hypertension with severe blood pressures. Severe pressures required 3 doses of labetalol per protocol in the MAU.   OB History    Gravida  2   Para  1   Term  1   Preterm      AB      Living  1     SAB      TAB      Ectopic      Multiple      Live Births  1          Past Medical History:  Diagnosis Date  . Anemia 2012  . Depression 2012   POST we loss SURGERY; MEDS X 2 WEEKS  . Diabetes mellitus without complication ([place])   . Gestational diabetes    diet controlled  . Infection 2005   UIT  . Infection    YEAST X 1  . PCOS (polycystic ovarian syndrome)   . Pneumonia    AS TEEN   Past Surgical History:  Procedure Laterality Date  . CESAREAN SECTION N/A 10/16/2012   Procedure: Primary Cesarean Section Delivery Baby Girl @ 8527, Apgars 8/9;  Surgeon: Maeola Sarah. Landry Mellow, MD;  Location: [facility];  Service: Obstetrics;  Laterality: N/A;  . TONSILLECTOMY  AGE 49  . VERTICAL SLEEVE GASGTRECTOMY  2012   Family History: family history includes Anemia in her mother; Aneurysm in her maternal uncle; Arthritis in her maternal grandmother; Diabetes in her maternal aunt and maternal grandmother; Heart disease in her maternal grandfather; Hyperlipidemia in her maternal grandfather and maternal grandmother; Hypertension in her maternal grandmother; Lupus in her cousin and maternal aunt; Seizures in her brother; Stroke in her maternal grandfather and maternal grandmother. Social History:  reports that she has never smoked. She has never used smokeless tobacco. She reports that she drank alcohol. She reports that she does not use drugs.  Review of Systems  Eyes: Negative for blurred vision.  Gastrointestinal: Negative for abdominal pain.  Neurological: Negative for headaches.  All other systems reviewed and are negative.   Vitals:   11/16/17 0346 11/16/17 0401 11/16/17 0431 11/16/17 0501  BP: (!) 142/60 (!) 150/65 (!)  156/91 (!) 167/90  Pulse: 77 88 90 88  Resp:      Temp:      Weight:      Height:       Physical Exam  Nursing note and vitals reviewed. Constitutional: She is oriented to person, place, and time. She appears well-developed and well-nourished.  HENT:  Head: Normocephalic.  Eyes: Pupils are equal, round, and reactive to light.  Cardiovascular: Normal rate, regular rhythm and normal heart sounds.  Respiratory: Effort normal and breath sounds normal.  GI: Soft. Bowel sounds are normal. There is no tenderness.  Musculoskeletal: Normal range of motion.  Neurological: She is alert and oriented to person, place, and time.  Skin: Skin is warm and dry.  Psychiatric: She has a normal mood and affect. Her behavior is normal. Judgment and thought content normal.     Assessment/Plan: 32 y.o. G2P1 at [redacted]w[redacted]d Chronic hypertension Admit to antepartum for observation until severe range blood pressures are controlled Procardia XL 30mg  QD  Marikay Alar 11/16/2017, 5:30 AM

## 2017-11-17 ENCOUNTER — Observation Stay (HOSPITAL_BASED_OUTPATIENT_CLINIC_OR_DEPARTMENT_OTHER): Payer: BLUE CROSS/BLUE SHIELD

## 2017-11-17 DIAGNOSIS — Z3A14 14 weeks gestation of pregnancy: Secondary | ICD-10-CM | POA: Diagnosis not present

## 2017-11-17 DIAGNOSIS — R7309 Other abnormal glucose: Secondary | ICD-10-CM

## 2017-11-17 DIAGNOSIS — O99212 Obesity complicating pregnancy, second trimester: Secondary | ICD-10-CM

## 2017-11-17 DIAGNOSIS — O99019 Anemia complicating pregnancy, unspecified trimester: Secondary | ICD-10-CM

## 2017-11-17 DIAGNOSIS — O24312 Unspecified pre-existing diabetes mellitus in pregnancy, second trimester: Secondary | ICD-10-CM | POA: Diagnosis not present

## 2017-11-17 DIAGNOSIS — O162 Unspecified maternal hypertension, second trimester: Secondary | ICD-10-CM

## 2017-11-17 LAB — HEMOGLOBIN A1C
Hgb A1c MFr Bld: 5.6 % (ref 4.8–5.6)
MEAN PLASMA GLUCOSE: 114.02 mg/dL

## 2017-11-17 LAB — TSH: TSH: 1.116 u[IU]/mL (ref 0.350–4.500)

## 2017-11-17 LAB — GLUCOSE TOLERANCE, 1 HOUR: GLUCOSE 1 HOUR GTT: 161 mg/dL — AB (ref 70–140)

## 2017-11-17 MED ORDER — NIFEDIPINE ER OSMOTIC RELEASE 30 MG PO TB24
90.0000 mg | ORAL_TABLET | Freq: Every day | ORAL | Status: DC
  Administered 2017-11-17: 90 mg via ORAL
  Filled 2017-11-17 (×2): qty 3

## 2017-11-17 MED ORDER — NIFEDIPINE ER OSMOTIC RELEASE 90 MG PO TB24: 90.0000 mg | ORAL_TABLET | Freq: Every day | ORAL | 3 refills | Status: DC

## 2017-11-17 MED ORDER — FERROUS SULFATE 325 (65 FE) MG PO TABS
325.0000 mg | ORAL_TABLET | Freq: Two times a day (BID) | ORAL | Status: DC
  Administered 2017-11-17 (×2): 325 mg via ORAL
  Filled 2017-11-17 (×3): qty 1

## 2017-11-17 NOTE — Discharge Summary (Signed)
OB Discharge Summary     Patient Name: Tracey Alvarez DOB: [birth year] MRN: [id number]  Date of admission: 11/16/2017 Admitting diagnosis: 14 wks bp high and cramping  Intrauterine pregnancy: [redacted]w[redacted]d     Secondary diagnosis:  Active Problems:   Chronic hypertension affecting pregnancy   Elevated glucose tolerance test   Anemia affecting pregnancy   History of Present Illness: Ms. Tracey Alvarez is a 32 y.o. female, G2P1001, who presents at [redacted]w[redacted]d weeks gestation. The patient has been followed at  [facility] and Gynecology  Her pregnancy has been complicated by:  Patient Active Problem List   Diagnosis Date Noted  . Elevated glucose tolerance test 11/17/2017  . Anemia affecting pregnancy 11/17/2017  . Chronic hypertension affecting pregnancy 11/16/2017  . Gestational diabetes 10/09/2012  . Uterine size date discrepancy 08/11/2012  . Hx of laparoscopic partial gastrectomy 08/11/2012   Hospital course:  Management of CHTN in second trimester of pregnancy  Pt was admitted on 08/31 with elevated BP, was on labetalol 200mg  daily at home but was non-responsive with BPs of 160s/80-90s, tx with IV labetalol x3 and x1 IV hydralazine, BP still remained elevated on 09/01 with highest being 168/89, started 90 Procardia XL daily this morning, with decreased in BP to 136/76 currently. Pt stable and denies HA, vision changes, no calf pain, no CP or SOB. Denies increased swelling or RUQ pain. No s/sx of anemia, and no s/sx such as polydipsia, or polyphaga. FHT doppler = 155 & 164. Pt admitted to being stress out with new move and in hospital has found herself teary eyed due to missing her baby girl. Family did not wants her child to visit out of fear of increasing her stress levels.   Per Dr Simona Huh on 11/17/2017: Consulted with Dr. Noralee Space, to discuss other causes of severe hypertension in this ob pt that does not have a h/o HTN. Concerned there might be another cause of  hypertension if she is requiring 2 agents this early in pregnancy.  He agreed with TSH, diabetes screening, 24 hour protein.  He recommended an ultrasound to rule out molar pregnancy.  Goal of BP is 150s/80s.  If out of range, start Labetalol 200 mg BID.  Can add VMA to rule out pheochromocytoma to 24 hour urine.  Medicine consult and possible transfer to ICU to closely monitor BP if severe range BPs persist.  TSH resulted 1.116 CMP: creatinine 0.65, ast 20, alt 23 CBC: Platelets 159, hgb 9.5, hct 29.4 UA: neg, but = for ketones (80) PCR: 0.08 HGBA1C: 5.6 1H GTT: 156 24 hours urine in progress and pending.   Physical exam  Vitals:   11/17/17 0804 11/17/17 1155 11/17/17 1553 11/17/17 1924  BP: (!) 152/84 (!) 145/95 (!) 147/76 136/76  Pulse: 79 78 97 93  Resp:  17 18 (!) 21  Temp:  99 F (37.2 C) 98.5 F (36.9 C) 99.1 F (37.3 C)  TempSrc:  Oral Oral Oral  SpO2:  100% 99% 100%  Weight:      Height:       Physical Exam  Constitutional: She is oriented to person, place, and time. She appears well-developed and well-nourished.  Obese  Genitourinary: Uterus normal.  Genitourinary Comments: Gravida unable to asses due to habitus.   HENT:  Head: Normocephalic and atraumatic.  Eyes: Pupils are equal, round, and reactive to light. Conjunctivae and EOM are normal.  Neck: Normal range of motion. Neck supple. No JVD present. No tracheal deviation present.  Cardiovascular: Normal rate, regular rhythm, normal heart sounds and intact distal pulses.  Pulmonary/Chest: Effort normal and breath sounds normal.  Abdominal: Soft. Bowel sounds are normal.  Musculoskeletal: Normal range of motion. She exhibits no edema.  Neurological: She is alert and oriented to person, place, and time. She displays normal reflexes.  Skin: Skin is warm and dry. Capillary refill takes 2 to 3 seconds.  Psychiatric: She has a normal mood and affect. Her behavior is normal. Judgment normal.  Nursing note and vitals  reviewed.   Labs: Lab Results  Component Value Date   WBC 9.9 11/16/2017   HGB 9.5 (L) 11/16/2017   HCT 29.4 (L) 11/16/2017   MCV 75.4 (L) 11/16/2017   PLT 159 11/16/2017   CMP Latest Ref Rng & Units 11/16/2017  Glucose 70 - 99 mg/dL 86  BUN 6 - 20 mg/dL 10  Creatinine 0.44 - 1.00 mg/dL 0.65  Sodium 135 - 145 mmol/L 134(L)  Potassium 3.5 - 5.1 mmol/L 3.4(L)  Chloride 98 - 111 mmol/L 104  CO2 22 - 32 mmol/L 19(L)  Calcium 8.9 - 10.3 mg/dL 8.7(L)  Total Protein 6.5 - 8.1 g/dL 7.0  Total Bilirubin 0.3 - 1.2 mg/dL 0.7  Alkaline Phos 38 - 126 U/L 34(L)  AST 15 - 41 U/L 20  ALT 0 - 44 U/L 23    Date of discharge: 11/17/2017 Discharge Diagnoses: CHTN affecting pregnancy, elevated GTT, anemia affecting pregnancy, obesity.   Discharge instruction: per After Visit Summary  After visit meds:  Allergies as of 11/17/2017      Reactions   Shellfish Allergy Shortness Of Breath      Medication List    STOP taking these medications   labetalol 200 MG tablet Commonly known as:  NORMODYNE     TAKE these medications   acetaminophen 500 MG tablet Commonly known as:  TYLENOL Take 500 mg by mouth every 6 (six) hours as needed for pain.   FUSION PLUS Caps Take 1 capsule by mouth daily.   NIFEdipine 90 MG 24 hr tablet Commonly known as:  PROCARDIA XL/ADALAT-CC Take 1 tablet (90 mg total) by mouth daily. Start taking on:  11/18/2017   prenatal multivitamin Tabs tablet Take 1 tablet by mouth daily at 12 noon.   Vitamin D3 5000 units Tabs Take 1 tablet by mouth daily.       Activity:           unrestricted Advance as tolerated. Pelvic rest for 6 weeks.  Diet:                routine, no sodium , heart healthy diets, diabetic diet.  Medications: Iron BID, Procardia 90mg  XL daily, PNV. Condition:  Pt discharge to home with baby in stable Anemia: asymptomatic CHTN: stable with BP 137/76 Elevated 1 H GTT: F/U in office with 3H GTT Rule out pheochromocytoma with 24 hours urine.    Meds: Allergies as of 11/17/2017      Reactions   Shellfish Allergy Shortness Of Breath      Medication List    STOP taking these medications   labetalol 200 MG tablet Commonly known as:  NORMODYNE     TAKE these medications   acetaminophen 500 MG tablet Commonly known as:  TYLENOL Take 500 mg by mouth every 6 (six) hours as needed for pain.   FUSION PLUS Caps Take 1 capsule by mouth daily.   NIFEdipine 90 MG 24 hr tablet Commonly known as:  PROCARDIA XL/ADALAT-CC Take 1 tablet (  90 mg total) by mouth daily. Start taking on:  11/18/2017   prenatal multivitamin Tabs tablet Take 1 tablet by mouth daily at 12 noon.   Vitamin D3 5000 units Tabs Take 1 tablet by mouth daily.       Discharge Follow Up:  [facility] Follow up in 2 day(s).   Specialty:  Obstetrics and Gynecology Why:  Appointment already made on 11/19/2017 wit Dr Alwyn Pea at [facility] BP check and 3HGTT Contact information: [address] [place] [phone] [place], NP-C, [place] 11/17/2017, 8:47 PM  Noralyn Pick, FNP

## 2017-11-17 NOTE — Progress Notes (Signed)
24 hour protein initiated at 12:40 pm.

## 2017-11-17 NOTE — Progress Notes (Signed)
Patient for discharge home.  Given instructions on 24 hour urine collection to end tomorrow 11/18/17 at 1240 and to bring specimen to [facility] lab as soon as possible after 1240 noon.  Patient provided with another red jag for urine container,urine hat and a basin.  Patient acknowledged understanding discharged instructions.

## 2017-11-17 NOTE — Progress Notes (Addendum)
Hospital day # 2 pregnancy at [redacted]w[redacted]d  SEE ADDENDUM BY DR. Simona Huh  S:     Contractions:none      Vaginal bleeding:none       Vaginal discharge: none and no significant change  O: BP (!) 152/84 (BP Location: Right Arm)   Pulse 79   Temp 98.5 F (36.9 C) (Oral)   Resp 18   Ht 5\' 8"  (1.727 m)   Wt (!) 137.9 kg   SpO2 100%   BMI 46.22 kg/m       Fetal tracings:doppler positive FHT's      Uterus consistent with 14 weeks and non-tender      Extremities: no significant edema and no signs of DVT  A: [redacted]w[redacted]d with Chronic Hypertension     stable  P: continue current plan of care      TSH      24 hour urine Lori A Clemmons CNM 11/17/2017 11:08 AM  Pt without complaints. Gen: NAD  Consulted with Dr. Noralee Space, to discuss other causes of severe hypertension in this ob pt that does not have a h/o HTN. Concerned there might be another cause of hypertension if she is requiring 2 agents this early in pregnancy.  He agreed with TSH, diabetes screening, 24 hour protein.  He recommended an ultrasound to rule out molar pregnancy.  Goal of BP is 150s/80s.  If out of range, start Labetalol 200 mg BID.  Can add VMA to rule out pheochromocytoma to 24 hour urine.  Medicine consult and possible transfer to ICU to closely monitor BP if severe range BPs persist.  Plan d/w pt and CNM.  Pt will be transferred to ultrasound now.  Meridee Score, MD  [phone] - cell

## 2017-11-17 NOTE — Discharge Instructions (Signed)
How to Take Your Blood Pressure Blood pressure is a measurement of how strongly your blood is pressing against the walls of your arteries. Arteries are blood vessels that carry blood from your heart throughout your body. Your health care provider takes your blood pressure at each office visit. You can also take your own blood pressure at home with a blood pressure machine. You may need to take your own blood pressure:  To confirm a diagnosis of high blood pressure (hypertension).  To monitor your blood pressure over time.  To make sure your blood pressure medicine is working.  Supplies needed: To take your blood pressure, you will need a blood pressure machine. You can buy a blood pressure machine, or blood pressure monitor, at most drugstores or online. There are several types of home blood pressure monitors. When choosing one, consider the following:  Choose a monitor that has an arm cuff.  Choose a monitor that wraps snugly around your upper arm. You should be able to fit only one finger between your arm and the cuff.  Do not choose a monitor that measures your blood pressure from your wrist or finger.  Your health care provider can suggest a reliable monitor that will meet your needs. How to prepare To get the most accurate reading, avoid the following for 30 minutes before you check your blood pressure:  Drinking caffeine.  Drinking alcohol.  Eating.  Smoking.  Exercising.  Five minutes before you check your blood pressure:  Empty your bladder.  Sit quietly without talking in a dining chair, rather than in a soft couch or armchair.  How to take your blood pressure To check your blood pressure, follow the instructions in the manual that came with your blood pressure monitor. If you have a digital blood pressure monitor, the instructions may be as follows: 1. Sit up straight. 2. Place your feet on the floor. Do not cross your ankles or legs. 3. Rest your left arm at the  level of your heart on a table or desk or on the arm of a chair. 4. Pull up your shirt sleeve. 5. Wrap the blood pressure cuff around the upper part of your left arm, 1 inch (2.5 cm) above your elbow. It is best to wrap the cuff around bare skin. 6. Fit the cuff snugly around your arm. You should be able to place only one finger between the cuff and your arm. 7. Position the cord inside the groove of your elbow. 8. Press the power button. 9. Sit quietly while the cuff inflates and deflates. 10. Read the digital reading on the monitor screen and write it down (record it). 11. Wait 2-3 minutes, then repeat the steps, starting at step 1.  What does my blood pressure reading mean? A blood pressure reading consists of a higher number over a lower number. Ideally, your blood pressure should be below 120/80. The first ("top") number is called the systolic pressure. It is a measure of the pressure in your arteries as your heart beats. The second ("bottom") number is called the diastolic pressure. It is a measure of the pressure in your arteries as the heart relaxes. Blood pressure is classified into four stages. The following are the stages for adults who do not have a short-term serious illness or a chronic condition. Systolic pressure and diastolic pressure are measured in a unit called mm Hg. Normal  Systolic pressure: below 016.  Diastolic pressure: below 80. Elevated  Systolic pressure: 010-932.  Diastolic  pressure: below 80. Hypertension stage 1  Systolic pressure: 852-778.  Diastolic pressure: 24-23. Hypertension stage 2  Systolic pressure: 536 or above.  Diastolic pressure: 90 or above. You can have prehypertension or hypertension even if only the systolic or only the diastolic number in your reading is higher than normal. Follow these instructions at home:  Check your blood pressure as often as recommended by your health care provider.  Take your monitor to the next appointment  with your health care provider to make sure: ? That you are using it correctly. ? That it provides accurate readings.  Be sure you understand what your goal blood pressure numbers are.  Tell your health care provider if you are having any side effects from blood pressure medicine. Contact a health care provider if:  Your blood pressure is consistently high. Get help right away if:  Your systolic blood pressure is higher than 180.  Your diastolic blood pressure is higher than 110. This information is not intended to replace advice given to you by your health care provider. Make sure you discuss any questions you have with your health care provider. Document Released: 08/12/2015 Document Revised: 10/25/2015 Document Reviewed: 08/12/2015 Elsevier Interactive Patient Education  2018 [employer].   Hypertension During Pregnancy Hypertension, commonly called high blood pressure, is when the force of blood pumping through your arteries is too strong. Arteries are blood vessels that carry blood from the heart throughout the body. Hypertension during pregnancy can cause problems for you and your baby. Your baby may be born early (prematurely) or may not weigh as much as he or she should at birth. Very bad cases of hypertension during pregnancy can be life-threatening. Different types of hypertension can occur during pregnancy. These include:  Chronic hypertension. This happens when: ? You have hypertension before pregnancy and it continues during pregnancy. ? You develop hypertension before you are [redacted] weeks pregnant, and it continues during pregnancy.  Gestational hypertension. This is hypertension that develops after the 20th week of pregnancy.  Preeclampsia, also called toxemia of pregnancy. This is a very serious type of hypertension that develops only during pregnancy. It affects the whole body, and it can be very dangerous for you and your baby.  Gestational hypertension and  preeclampsia usually go away within 6 weeks after your baby is born. Women who have hypertension during pregnancy have a greater chance of developing hypertension later in life or during future pregnancies. What are the causes? The exact cause of hypertension is not known. What increases the risk? There are certain factors that make it more likely for you to develop hypertension during pregnancy. These include:  Having hypertension during a previous pregnancy or prior to pregnancy.  Being overweight.  Being older than age 33.  Being pregnant for the first time or being pregnant with more than one baby.  Becoming pregnant using fertilization methods such as IVF (in vitro fertilization).  Having diabetes, kidney problems, or systemic lupus erythematosus.  Having a family history of hypertension.  What are the signs or symptoms? Chronic hypertension and gestational hypertension rarely cause symptoms. Preeclampsia causes symptoms, which may include:  Increased protein in your urine. Your health care provider will check for this at every visit before you give birth (prenatal visit).  Severe headaches.  Sudden weight gain.  Swelling of the hands, face, legs, and feet.  Nausea and vomiting.  Vision problems, such as blurred or double vision.  Numbness in the face, arms, legs, and feet.  Dizziness.  Slurred speech.  Sensitivity to bright lights.  Abdominal pain.  Convulsions.  How is this diagnosed? You may be diagnosed with hypertension during a routine prenatal exam. At each prenatal visit, you may:  Have a urine test to check for high amounts of protein in your urine.  Have your blood pressure checked. A blood pressure reading is recorded as two numbers, such as "120 over 80" (or 120/80). The first ("top") number is called the systolic pressure. It is a measure of the pressure in your arteries when your heart beats. The second ("bottom") number is called the diastolic  pressure. It is a measure of the pressure in your arteries as your heart relaxes between beats. Blood pressure is measured in a unit called mm Hg. A normal blood pressure reading is: ? Systolic: below 622. ? Diastolic: below 80.  The type of hypertension that you are diagnosed with depends on your test results and when your symptoms developed.  Chronic hypertension is usually diagnosed before 20 weeks of pregnancy.  Gestational hypertension is usually diagnosed after 20 weeks of pregnancy.  Hypertension with high amounts of protein in the urine is diagnosed as preeclampsia.  Blood pressure measurements that stay above 297 systolic, or above 989 diastolic, are signs of severe preeclampsia.  How is this treated? Treatment for hypertension during pregnancy varies depending on the type of hypertension you have and how serious it is.  If you take medicines called ACE inhibitors to treat chronic hypertension, you may need to switch medicines. ACE inhibitors should not be taken during pregnancy.  If you have gestational hypertension, you may need to take blood pressure medicine.  If you are at risk for preeclampsia, your health care provider may recommend that you take a low-dose aspirin every day to prevent high blood pressure during your pregnancy.  If you have severe preeclampsia, you may need to be hospitalized so you and your baby can be monitored closely. You may also need to take medicine (magnesium sulfate) to prevent seizures and to lower blood pressure. This medicine may be given as an injection or through an IV tube.  In some cases, if your condition gets worse, you may need to deliver your baby early.  Follow these instructions at home: Eating and drinking  Drink enough fluid to keep your urine clear or pale yellow.  Eat a healthy diet that is low in salt (sodium). Do not add salt to your food. Check food labels to see how much sodium a food or beverage contains. Lifestyle  Do  not use any products that contain nicotine or tobacco, such as cigarettes and e-cigarettes. If you need help quitting, ask your health care provider.  Do not use alcohol.  Avoid caffeine.  Avoid stress as much as possible. Rest and get plenty of sleep. General instructions  Take over-the-counter and prescription medicines only as told by your health care provider.  While lying down, lie on your left side. This keeps pressure off your baby.  While sitting or lying down, raise (elevate) your feet. Try putting some pillows under your lower legs.  Exercise regularly. Ask your health care provider what kinds of exercise are best for you.  Keep all prenatal and follow-up visits as told by your health care provider. This is important. Contact a health care provider if:  You have symptoms that your health care provider told you may require more treatment or monitoring, such as: ? Fever. ? Vomiting. ? Headache. Get help right away if:  You have severe abdominal pain or vomiting that does not get better with treatment.  You suddenly develop swelling in your hands, ankles, or face.  You gain 4 lbs (1.8 kg) or more in 1 week.  You develop vaginal bleeding, or you have blood in your urine.  You do not feel your baby moving as much as usual.  You have blurred or double vision.  You have muscle twitching or sudden tightening (spasms).  You have shortness of breath.  Your lips or fingernails turn blue. This information is not intended to replace advice given to you by your health care provider. Make sure you discuss any questions you have with your health care provider. Document Released: 11/21/2010 Document Revised: 09/23/2015 Document Reviewed: 08/19/2015 Elsevier Interactive Patient Education  2018 [employer].   Preeclampsia and Eclampsia Preeclampsia is a serious condition that develops only during pregnancy. It is also called toxemia of pregnancy. This condition causes high  blood pressure along with other symptoms, such as swelling and headaches. These symptoms may develop as the condition gets worse. Preeclampsia may occur at 20 weeks of pregnancy or later. Diagnosing and treating preeclampsia early is very important. If not treated early, it can cause serious problems for you and your baby. One problem it can lead to is eclampsia, which is a condition that causes muscle jerking or shaking (convulsions or seizures) in the mother. Delivering your baby is the best treatment for preeclampsia or eclampsia. Preeclampsia and eclampsia symptoms usually go away after your baby is born. What are the causes? The cause of preeclampsia is not known. What increases the risk? The following risk factors make you more likely to develop preeclampsia:  Being pregnant for the first time.  Having had preeclampsia during a past pregnancy.  Having a family history of preeclampsia.  Having high blood pressure.  Being pregnant with twins or triplets.  Being 51 or older.  Being African-American.  Having kidney disease or diabetes.  Having medical conditions such as lupus or blood diseases.  Being very overweight (obese).  What are the signs or symptoms? The earliest signs of preeclampsia are:  High blood pressure.  Increased protein in your urine. Your health care provider will check for this at every visit before you give birth (prenatal visit).  Other symptoms that may develop as the condition gets worse include:  Severe headaches.  Sudden weight gain.  Swelling of the hands, face, legs, and feet.  Nausea and vomiting.  Vision problems, such as blurred or double vision.  Numbness in the face, arms, legs, and feet.  Urinating less than usual.  Dizziness.  Slurred speech.  Abdominal pain, especially upper abdominal pain.  Convulsions or seizures.  Symptoms generally go away after giving birth. How is this diagnosed? There are no screening tests for  preeclampsia. Your health care provider will ask you about symptoms and check for signs of preeclampsia during your prenatal visits. You may also have tests that include:  Urine tests.  Blood tests.  Checking your blood pressure.  Monitoring your babys heart rate.  Ultrasound.  How is this treated? You and your health care provider will determine the treatment approach that is best for you. Treatment may include:  Having more frequent prenatal exams to check for signs of preeclampsia, if you have an increased risk for preeclampsia.  Bed rest.  Reducing how much salt (sodium) you eat.  Medicine to lower your blood pressure.  Staying in the hospital, if your condition is severe. There,  treatment will focus on controlling your blood pressure and the amount of fluids in your body (fluid retention).  You may need to take medicine (magnesium sulfate) to prevent seizures. This medicine may be given as an injection or through an IV tube.  Delivering your baby early, if your condition gets worse. You may have your labor started with medicine (induced), or you may have a cesarean delivery.  Follow these instructions at home: Eating and drinking   Drink enough fluid to keep your urine clear or pale yellow.  Eat a healthy diet that is low in sodium. Do not add salt to your food. Check nutrition labels to see how much sodium a food or beverage contains.  Avoid caffeine. Lifestyle  Do not use any products that contain nicotine or tobacco, such as cigarettes and e-cigarettes. If you need help quitting, ask your health care provider.  Do not use alcohol or drugs.  Avoid stress as much as possible. Rest and get plenty of sleep. General instructions  Take over-the-counter and prescription medicines only as told by your health care provider.  When lying down, lie on your side. This keeps pressure off of your baby.  When sitting or lying down, raise (elevate) your feet. Try putting  some pillows underneath your lower legs.  Exercise regularly. Ask your health care provider what kinds of exercise are best for you.  Keep all follow-up and prenatal visits as told by your health care provider. This is important. How is this prevented? To prevent preeclampsia or eclampsia from developing during another pregnancy:  Get proper medical care during pregnancy. Your health care provider may be able to prevent preeclampsia or diagnose and treat it early.  Your health care provider may have you take a low-dose aspirin or a calcium supplement during your next pregnancy.  You may have tests of your blood pressure and kidney function after giving birth.  Maintain a healthy weight. Ask your health care provider for help managing weight gain during pregnancy.  Work with your health care provider to manage any long-term (chronic) health conditions you have, such as diabetes or kidney problems.  Contact a health care provider if:  You gain more weight than expected.  You have headaches.  You have nausea or vomiting.  You have abdominal pain.  You feel dizzy or light-headed. Get help right away if:  You develop sudden or severe swelling anywhere in your body. This usually happens in the legs.  You gain 5 lbs (2.3 kg) or more during one week.  You have severe: ? Abdominal pain. ? Headaches. ? Dizziness. ? Vision problems. ? Confusion. ? Nausea or vomiting.  You have a seizure.  You have trouble moving any part of your body.  You develop numbness in any part of your body.  You have trouble speaking.  You have any abnormal bleeding.  You pass out. This information is not intended to replace advice given to you by your health care provider. Make sure you discuss any questions you have with your health care provider. Document Released: 03/02/2000 Document Revised: 11/01/2015 Document Reviewed: 10/10/2015 Elsevier Interactive Patient Education  2018 [employer].   Preventing Cerebrovascular Disease Arteries are blood vessels that carry blood that contains oxygen from the heart to all parts of the body. Cerebrovascular disease affects arteries that supply the brain. Any condition that blocks or disrupts blood flow to the brain can cause cerebrovascular disease. Brain cells that lose blood supply start to die within minutes (stroke).  Stroke is the main danger of cerebrovascular disease. Atherosclerosis and high blood pressure are common causes of cerebrovascular disease. Atherosclerosis is narrowing and hardening of an artery that results when fat, cholesterol, calcium, or other substances (plaque) build up inside an artery. Plaque reduces blood flow through the artery. High blood pressure increases the risk of bleeding inside the brain. Making diet and lifestyle changes to prevent atherosclerosis and high blood pressure lowers your risk of cerebrovascular disease. What nutrition changes can be made?  Eat more fruits, vegetables, and whole grains.  Reduce how much saturated fat you eat. To do this, eat less red meat and fewer full-fat dairy products.  Eat healthy proteins instead of red meat. Healthy proteins include: ? Fish. Eat fish that contains heart-healthy omega-3 fatty acids, twice a week. Examples include salmon, albacore tuna, mackerel, and herring. ? Chicken. ? Nuts. ? Low-fat or nonfat yogurt.  Avoid processed meats, like bacon and lunchmeat.  Avoid foods that contain: ? A lot of sugar, such as sweets and drinks with added sugar. ? A lot of salt (sodium). Avoid adding extra salt to your food, as told by your health care provider. ? Trans fats, such as margarine and baked goods. Trans fats may be listed as "partially hydrogenated oils on food labels.  Check food labels to see how much sodium, sugar, and trans fats are in foods.  Use vegetable oils that contain low amounts of saturated fat, such as olive oil or canola oil. What  lifestyle changes can be made?  Drink alcohol in moderation. This means no more than 1 drink a day for nonpregnant women and 2 drinks a day for men. One drink equals 12 oz of beer, 5 oz of wine, or 1 oz of hard liquor.  If you are overweight, ask your health care provider to recommend a weight-loss plan for you. Losing 5-10 lb (2.2-4.5 kg) can reduce your risk of diabetes, atherosclerosis, and high blood pressure.  Exercise for 30?60 minutes on most days, or as much as told by your health care provider. ? Do moderate-intensity exercise, such as brisk walking, bicycling, and water aerobics. Ask your health care provider which activities are safe for you.  Do not use any products that contain nicotine or tobacco, such as cigarettes and e-cigarettes. If you need help quitting, ask your health care provider. Why are these changes important? Making these changes lowers your risk of many diseases that can cause cerebrovascular disease and stroke. Stroke is a leading cause of death and disability. Making these changes also improves your overall health and quality of life. What can I do to lower my risk? The following factors make you more likely to develop cerebrovascular disease:  Being overweight.  Smoking.  Being physically inactive.  Eating a high-fat diet.  Having certain health conditions, such as: ? Diabetes. ? High blood pressure. ? Heart disease. ? Atherosclerosis. ? High cholesterol. ? Sickle cell disease.  Talk with your health care provider about your risk for cerebrovascular disease. Work with your health care provider to control diseases that you have that may contribute to cerebrovascular disease. Your health care provider may prescribe medicines to help prevent major causes of cerebrovascular disease. Where to find more information: Learn more about preventing cerebrovascular disease from:  [facility] and [place]:  [employer]de  Centers for Disease Control and Prevention: http://www.curry-wood.biz/  Summary  Cerebrovascular disease can lead to a stroke.  Atherosclerosis and high blood pressure are major causes of cerebrovascular disease.  Making diet and lifestyle changes can reduce your risk of cerebrovascular disease.  Work with your health care provider to get your risk factors under control to reduce your risk of cerebrovascular disease. This information is not intended to replace advice given to you by your health care provider. Make sure you discuss any questions you have with your health care provider. Document Released: 03/20/2015 Document Revised: 09/23/2015 Document Reviewed: 03/20/2015 Elsevier Interactive Patient Education  2018 [employer].

## 2017-11-18 ENCOUNTER — Other Ambulatory Visit (HOSPITAL_COMMUNITY)
Admission: RE | Admit: 2017-11-18 | Discharge: 2017-11-18 | Disposition: A | Discharge status: Home / Self Care | Payer: BLUE CROSS/BLUE SHIELD | Source: Ambulatory Visit | Attending: Obstetrics & Gynecology | Admitting: Obstetrics & Gynecology

## 2017-11-18 DIAGNOSIS — O10912 Unspecified pre-existing hypertension complicating pregnancy, second trimester: Secondary | ICD-10-CM | POA: Diagnosis not present

## 2017-11-18 DIAGNOSIS — Z3A14 14 weeks gestation of pregnancy: Secondary | ICD-10-CM | POA: Insufficient documentation

## 2017-11-18 LAB — PROTEIN, URINE, 24 HOUR
Collection Interval-UPROT: 24 hours
PROTEIN, 24H URINE: 195 mg/d — AB (ref 50–100)
PROTEIN, URINE: 12 mg/dL
URINE TOTAL VOLUME-UPROT: 1625 mL

## 2018-03-04 ENCOUNTER — Inpatient Hospital Stay (HOSPITAL_BASED_OUTPATIENT_CLINIC_OR_DEPARTMENT_OTHER): Payer: BLUE CROSS/BLUE SHIELD

## 2018-03-04 ENCOUNTER — Inpatient Hospital Stay (HOSPITAL_COMMUNITY)
Admission: AD | Admit: 2018-03-04 | Discharge: 2018-03-04 | Disposition: A | Discharge status: Home / Self Care | Payer: BLUE CROSS/BLUE SHIELD | Source: Ambulatory Visit | Attending: Obstetrics and Gynecology | Admitting: Obstetrics and Gynecology

## 2018-03-04 ENCOUNTER — Encounter (HOSPITAL_COMMUNITY): Payer: Self-pay

## 2018-03-04 DIAGNOSIS — R7309 Other abnormal glucose: Secondary | ICD-10-CM

## 2018-03-04 DIAGNOSIS — Z3A29 29 weeks gestation of pregnancy: Secondary | ICD-10-CM | POA: Diagnosis not present

## 2018-03-04 DIAGNOSIS — O368390 Maternal care for abnormalities of the fetal heart rate or rhythm, unspecified trimester, not applicable or unspecified: Secondary | ICD-10-CM

## 2018-03-04 DIAGNOSIS — O368130 Decreased fetal movements, third trimester, not applicable or unspecified: Secondary | ICD-10-CM

## 2018-03-04 DIAGNOSIS — R7302 Impaired glucose tolerance (oral): Secondary | ICD-10-CM | POA: Insufficient documentation

## 2018-03-04 DIAGNOSIS — O163 Unspecified maternal hypertension, third trimester: Secondary | ICD-10-CM

## 2018-03-04 DIAGNOSIS — O34219 Maternal care for unspecified type scar from previous cesarean delivery: Secondary | ICD-10-CM

## 2018-03-04 DIAGNOSIS — O368330 Maternal care for abnormalities of the fetal heart rate or rhythm, third trimester, not applicable or unspecified: Secondary | ICD-10-CM | POA: Diagnosis not present

## 2018-03-04 DIAGNOSIS — O99213 Obesity complicating pregnancy, third trimester: Secondary | ICD-10-CM | POA: Diagnosis not present

## 2018-03-04 LAB — COMPREHENSIVE METABOLIC PANEL
ALK PHOS: 86 U/L (ref 38–126)
ALT: 19 U/L (ref 0–44)
ANION GAP: 6 (ref 5–15)
AST: 15 U/L (ref 15–41)
Albumin: 3 g/dL — ABNORMAL LOW (ref 3.5–5.0)
BUN: 13 mg/dL (ref 6–20)
CALCIUM: 8.7 mg/dL — AB (ref 8.9–10.3)
CO2: 21 mmol/L — AB (ref 22–32)
CREATININE: 0.86 mg/dL (ref 0.44–1.00)
Chloride: 108 mmol/L (ref 98–111)
GFR calc Af Amer: >60 mL/min (ref >60)
GFR calc non Af Amer: >60 mL/min (ref >60)
Glucose, Bld: 109 mg/dL — ABNORMAL HIGH (ref 70–99)
Potassium: 3.8 mmol/L (ref 3.5–5.1)
SODIUM: 135 mmol/L (ref 135–145)
Total Bilirubin: 0.2 mg/dL — ABNORMAL LOW (ref 0.3–1.2)
Total Protein: 6.4 g/dL — ABNORMAL LOW (ref 6.5–8.1)

## 2018-03-04 LAB — CBC
HCT: 28.3 % — ABNORMAL LOW (ref 36.0–46.0)
HEMOGLOBIN: 9 g/dL — AB (ref 12.0–15.0)
MCH: 25.1 pg — ABNORMAL LOW (ref 26.0–34.0)
MCHC: 31.8 g/dL (ref 30.0–36.0)
MCV: 79.1 fL — ABNORMAL LOW (ref 80.0–100.0)
NRBC: 0 % (ref 0.0–0.2)
Platelets: 164 10*3/uL (ref 150–400)
RBC: 3.58 MIL/uL — AB (ref 3.87–5.11)
RDW: 15.9 % — ABNORMAL HIGH (ref 11.5–15.5)
WBC: 12.6 10*3/uL — ABNORMAL HIGH (ref 4.0–10.5)

## 2018-03-04 LAB — URINALYSIS, ROUTINE W REFLEX MICROSCOPIC
Bilirubin Urine: NEGATIVE
GLUCOSE, UA: NEGATIVE mg/dL
Hgb urine dipstick: NEGATIVE
Ketones, ur: 5 mg/dL — AB
LEUKOCYTES UA: NEGATIVE
Nitrite: NEGATIVE
PROTEIN: NEGATIVE mg/dL
Specific Gravity, Urine: 1.019 (ref 1.005–1.030)
pH: 6 (ref 5.0–8.0)

## 2018-03-04 LAB — PROTEIN / CREATININE RATIO, URINE
Creatinine, Urine: 202 mg/dL
PROTEIN CREATININE RATIO: 0.09 mg/mg{creat} (ref 0.00–0.15)
Total Protein, Urine: 18 mg/dL

## 2018-03-04 NOTE — Discharge Instructions (Signed)

## 2018-03-04 NOTE — MAU Provider Note (Signed)
Chief Complaint:  Decreased Fetal Movement; Pelvic Pain; and Headache   None    HPI: Tracey Alvarez is a 32 y.o. G2P1001 at [redacted]w[redacted]d who presents to maternity admissions reporting headache and fetal tachycardia noted in office visit.  Sent from office.  Prenatal hx of chronic hypertension in pregnancy.  FM+.  Location: headache Quality: mild Severity: 2/10 in pain scale Duration: today Context: Denies contractions, leakage of fluid or vaginal bleeding. Good fetal movement.   Pregnancy Course:   Past Medical History:  Diagnosis Date  . Anemia 2012  . Depression 2012   POST we loss SURGERY; MEDS X 2 WEEKS  . Diabetes mellitus without complication ([place])   . Gestational diabetes    diet controlled  . Infection 2005   UIT  . Infection    YEAST X 1  . PCOS (polycystic ovarian syndrome)   . Pneumonia    AS TEEN   OB History  Gravida Para Term Preterm AB Living  2 1 1     1   SAB TAB Ectopic Multiple Live Births          1    # Outcome Date GA Lbr Len/2nd Weight Sex Delivery Anes PTL Lv  2 Current           1 Term 10/16/12 [redacted]w[redacted]d  3755 g F CS-LTranv EPI  LIV   Past Surgical History:  Procedure Laterality Date  . CESAREAN SECTION N/A 10/16/2012   Procedure: Primary Cesarean Section Delivery Baby Girl @ 4970, Apgars 8/9;  Surgeon: Maeola Sarah. Landry Mellow, MD;  Location: [facility];  Service: Obstetrics;  Laterality: N/A;  . TONSILLECTOMY  AGE 72  . VERTICAL SLEEVE [place]  2012   Family History  Problem Relation Age of Onset  . Anemia Mother   . Seizures Brother        CHILDHOOD  . Diabetes Maternal Aunt   . Lupus Maternal Aunt   . Aneurysm Maternal Uncle   . Arthritis Maternal Grandmother   . Diabetes Maternal Grandmother   . Hyperlipidemia Maternal Grandmother   . Hypertension Maternal Grandmother   . Stroke Maternal Grandmother   . Heart disease Maternal Grandfather   . Hyperlipidemia Maternal Grandfather   . Stroke Maternal Grandfather   . Lupus Cousin    Social History    Tobacco Use  . Smoking status: Never Smoker  . Smokeless tobacco: Never Used  Substance Use Topics  . Alcohol use: Not Currently    Comment: RARELY  . Drug use: No   Allergies  Allergen Reactions  . Shellfish Allergy Shortness Of Breath   Medications Prior to Admission  Medication Sig Dispense Refill Last Dose  . acetaminophen (TYLENOL) 500 MG tablet Take 500 mg by mouth every 6 (six) hours as needed for pain.   Past Week at Unknown time  . Cholecalciferol (VITAMIN D3) 5000 units TABS Take 1 tablet by mouth daily.   Past Week at Unknown time  . Iron-FA-B Cmp-C-Biot-Probiotic (FUSION PLUS) CAPS Take 1 capsule by mouth daily.   11/15/2017 at Unknown time  . NIFEdipine (PROCARDIA XL/ADALAT-CC) 90 MG 24 hr tablet Take 1 tablet (90 mg total) by mouth daily. 30 tablet 3   . Prenatal Vit-Fe Fumarate-FA (PRENATAL MULTIVITAMIN) TABS Take 1 tablet by mouth daily at 12 noon.   11/15/2017 at Unknown time    I have reviewed patient's Past Medical Hx, Surgical Hx, Family Hx, Social Hx, medications and allergies.   ROS:  Review of Systems  Physical Exam  Patient Vitals for the past 24 hrs:  BP Temp Temp src Pulse Resp SpO2 Height Weight  03/04/18 1854 116/60 - - - - - - -  03/04/18 1822 137/83 98.2 F (36.8 C) Oral 99 19 99 % 5\' 8"  (1.727 m) 136.1 kg   Constitutional: Well-developed, well-nourished female in no acute distress.  Cardiovascular: normal rate Respiratory: normal effort GI: Abd soft, non-tender, gravid appropriate for gestational age. Pos BS x 4 MS: Extremities nontender, no edema, normal ROM Neurologic: Alert and oriented x 4.  GU: Neg CVAT.  Pelvic: NEFG, physiologic discharge, no blood, cervix clean. No CMT     FHT:  Baseline 155 , moderate variability, accelerations present, no decelerations Contractions: none   Labs: Results for orders placed or performed during the hospital encounter of 03/04/18 (from the past 24 hour(s))  CBC     Status: Abnormal   Collection  Time: 03/04/18  7:12 PM  Result Value Ref Range   WBC 12.6 (H) 4.0 - 10.5 K/uL   RBC 3.58 (L) 3.87 - 5.11 MIL/uL   Hemoglobin 9.0 (L) 12.0 - 15.0 g/dL   HCT 28.3 (L) 36.0 - 46.0 %   MCV 79.1 (L) 80.0 - 100.0 fL   MCH 25.1 (L) 26.0 - 34.0 pg   MCHC 31.8 30.0 - 36.0 g/dL   RDW 15.9 (H) 11.5 - 15.5 %   Platelets 164 150 - 400 K/uL   nRBC 0.0 0.0 - 0.2 %  Comprehensive metabolic panel     Status: Abnormal   Collection Time: 03/04/18  7:12 PM  Result Value Ref Range   Sodium 135 135 - 145 mmol/L   Potassium 3.8 3.5 - 5.1 mmol/L   Chloride 108 98 - 111 mmol/L   CO2 21 (L) 22 - 32 mmol/L   Glucose, Bld 109 (H) 70 - 99 mg/dL   BUN 13 6 - 20 mg/dL   Creatinine, Ser 0.86 0.44 - 1.00 mg/dL   Calcium 8.7 (L) 8.9 - 10.3 mg/dL   Total Protein 6.4 (L) 6.5 - 8.1 g/dL   Albumin 3.0 (L) 3.5 - 5.0 g/dL   AST 15 15 - 41 U/L   ALT 19 0 - 44 U/L   Alkaline Phosphatase 86 38 - 126 U/L   Total Bilirubin 0.2 (L) 0.3 - 1.2 mg/dL   GFR calc non Af Amer >60 >60 mL/min   GFR calc Af Amer >60 >60 mL/min   Anion gap 6 5 - 15  Protein / creatinine ratio, urine     Status: None   Collection Time: 03/04/18  7:37 PM  Result Value Ref Range   Creatinine, Urine 202.00 mg/dL   Total Protein, Urine 18 mg/dL   Protein Creatinine Ratio 0.09 0.00 - 0.15 mg/mg[Cre]  Urinalysis, Routine w reflex microscopic     Status: Abnormal   Collection Time: 03/04/18  7:37 PM  Result Value Ref Range   Color, Urine YELLOW YELLOW   APPearance CLEAR CLEAR   Specific Gravity, Urine 1.019 1.005 - 1.030   pH 6.0 5.0 - 8.0   Glucose, UA NEGATIVE NEGATIVE mg/dL   Hgb urine dipstick NEGATIVE NEGATIVE   Bilirubin Urine NEGATIVE NEGATIVE   Ketones, ur 5 (A) NEGATIVE mg/dL   Protein, ur NEGATIVE NEGATIVE mg/dL   Nitrite NEGATIVE NEGATIVE   Leukocytes, UA NEGATIVE NEGATIVE    Imaging:  No results found.  MAU Course: Orders Placed This Encounter  Procedures  . [place] MFM Fetal BPP Wo Non Stress  . CBC  .  Comprehensive metabolic  panel  . Protein / creatinine ratio, urine  . Urinalysis, Routine w reflex microscopic  . Discharge patient Discharge disposition: 01-Home or Self Care; Discharge patient date: 03/04/2018   No orders of the defined types were placed in this encounter. BPP 8/8 AFI of 11.06  MDM: Results reviewed.  Fetus normal heart rate.   Assessment: 1. Maternal care for fetal tachycardia during pregnancy   2. Elevated glucose tolerance test     Plan: Discharge home in stable condition. Increase fluids.  Continue with iron and high iron foods. Fetal kick counts Follow-up [facility] Follow up in 2 week(s).   Specialty:  Obstetrics and Gynecology Contact information: [address] [place] [address] [phone]          Allergies as of 03/04/2018      Reactions   Shellfish Allergy Shortness Of Breath      Medication List    TAKE these medications   acetaminophen 500 MG tablet Commonly known as:  TYLENOL Take 500 mg by mouth every 6 (six) hours as needed for pain.   FUSION PLUS Caps Take 1 capsule by mouth daily.   NIFEdipine 90 MG 24 hr tablet Commonly known as:  PROCARDIA XL/NIFEDICAL-XL Take 1 tablet (90 mg total) by mouth daily.   prenatal multivitamin Tabs tablet Take 1 tablet by mouth daily at 12 noon.   Vitamin D3 125 MCG (5000 UT) Tabs Take 1 tablet by mouth daily.       Starla Link, CNM 03/04/2018 9:46 PM

## 2018-03-04 NOTE — MAU Note (Signed)
Pt reports decreased fetal movement today, also reports pain in groin area , headache, and "spots" in her vision.

## 2018-03-28 ENCOUNTER — Other Ambulatory Visit (HOSPITAL_COMMUNITY): Payer: Self-pay | Admitting: Obstetrics and Gynecology

## 2018-03-28 DIAGNOSIS — Z3A33 33 weeks gestation of pregnancy: Secondary | ICD-10-CM

## 2018-03-28 DIAGNOSIS — O10919 Unspecified pre-existing hypertension complicating pregnancy, unspecified trimester: Secondary | ICD-10-CM

## 2018-04-03 ENCOUNTER — Encounter (HOSPITAL_COMMUNITY): Payer: Self-pay

## 2018-04-03 ENCOUNTER — Ambulatory Visit (HOSPITAL_COMMUNITY)
Admission: RE | Admit: 2018-04-03 | Discharge: 2018-04-03 | Disposition: A | Discharge status: Home / Self Care | Payer: BLUE CROSS/BLUE SHIELD | Source: Ambulatory Visit | Attending: Obstetrics and Gynecology | Admitting: Obstetrics and Gynecology

## 2018-04-03 DIAGNOSIS — Z3A33 33 weeks gestation of pregnancy: Secondary | ICD-10-CM | POA: Diagnosis present

## 2018-04-03 DIAGNOSIS — O34219 Maternal care for unspecified type scar from previous cesarean delivery: Secondary | ICD-10-CM | POA: Diagnosis not present

## 2018-04-03 DIAGNOSIS — O99213 Obesity complicating pregnancy, third trimester: Secondary | ICD-10-CM | POA: Diagnosis not present

## 2018-04-03 DIAGNOSIS — O10919 Unspecified pre-existing hypertension complicating pregnancy, unspecified trimester: Secondary | ICD-10-CM | POA: Insufficient documentation

## 2018-04-03 DIAGNOSIS — O163 Unspecified maternal hypertension, third trimester: Secondary | ICD-10-CM | POA: Diagnosis not present

## 2018-04-03 DIAGNOSIS — O99843 Bariatric surgery status complicating pregnancy, third trimester: Secondary | ICD-10-CM

## 2018-04-03 DIAGNOSIS — O09293 Supervision of pregnancy with other poor reproductive or obstetric history, third trimester: Secondary | ICD-10-CM | POA: Diagnosis not present

## 2018-04-04 ENCOUNTER — Other Ambulatory Visit: Payer: Self-pay | Admitting: Obstetrics & Gynecology

## 2018-04-18 ENCOUNTER — Encounter (HOSPITAL_COMMUNITY): Payer: Self-pay

## 2018-05-01 ENCOUNTER — Encounter (HOSPITAL_COMMUNITY)
Admission: RE | Admit: 2018-05-01 | Discharge: 2018-05-01 | Disposition: A | Discharge status: Home / Self Care | Payer: BLUE CROSS/BLUE SHIELD | Source: Ambulatory Visit | Attending: Obstetrics & Gynecology | Admitting: Obstetrics & Gynecology

## 2018-05-01 HISTORY — DX: Gestational (pregnancy-induced) hypertension without significant proteinuria, unspecified trimester: O13.9

## 2018-05-01 LAB — CBC
HCT: 30.3 % — ABNORMAL LOW (ref 36.0–46.0)
Hemoglobin: 9.8 g/dL — ABNORMAL LOW (ref 12.0–15.0)
MCH: 25.1 pg — ABNORMAL LOW (ref 26.0–34.0)
MCHC: 32.3 g/dL (ref 30.0–36.0)
MCV: 77.5 fL — ABNORMAL LOW (ref 80.0–100.0)
Platelets: 145 10*3/uL — ABNORMAL LOW (ref 150–400)
RBC: 3.91 MIL/uL (ref 3.87–5.11)
RDW: 15 % (ref 11.5–15.5)
WBC: 11.5 10*3/uL — AB (ref 4.0–10.5)
nRBC: 0 % (ref 0.0–0.2)

## 2018-05-01 NOTE — Patient Instructions (Signed)
Tracey Alvarez  05/01/2018   Your procedure is scheduled on:  05/02/2018  Enter through the Main Entrance of [facility] up the phone at the desk and dial [phone]  Call this number if you have problems the morning of [phone]  Remember:   Do not eat food:(After Midnight) Desps de medianoche.  Do not drink clear liquids: (After Midnight) Desps de medianoche.  Take these medicines the morning of surgery with A SIP OF WATER: take nifedipine as prescribed   Do not wear jewelry, make-up or nail polish.  Do not wear lotions, powders, or perfumes. Do not wear deodorant.  Do not shave 48 hours prior to surgery.  Do not bring valuables to the hospital.  [facility] is not   responsible for any belongings or valuables brought to the hospital.  Contacts, dentures or bridgework may not be worn into surgery.  Leave suitcase in the car. After surgery it may be brought to your room.  For patients admitted to the hospital, checkout time is 11:00 AM the day of              discharge.    N/A   Please read over the following fact sheets that you were given:   Surgical Site Infection Prevention

## 2018-05-02 ENCOUNTER — Encounter (HOSPITAL_COMMUNITY)
Admission: RE | Disposition: A | Discharge status: Home / Self Care | Payer: Self-pay | Source: Home / Self Care | Attending: Obstetrics & Gynecology

## 2018-05-02 ENCOUNTER — Inpatient Hospital Stay (HOSPITAL_COMMUNITY): Payer: BLUE CROSS/BLUE SHIELD

## 2018-05-02 ENCOUNTER — Inpatient Hospital Stay (HOSPITAL_COMMUNITY)
Admission: RE | Admit: 2018-05-02 | Discharge: 2018-05-04 | DRG: 787 | Disposition: A | Discharge status: Home / Self Care | Payer: BLUE CROSS/BLUE SHIELD | Attending: Obstetrics & Gynecology | Admitting: Obstetrics & Gynecology

## 2018-05-02 ENCOUNTER — Encounter (HOSPITAL_COMMUNITY): Payer: Self-pay | Admitting: Anesthesiology

## 2018-05-02 ENCOUNTER — Other Ambulatory Visit: Payer: Self-pay

## 2018-05-02 DIAGNOSIS — O99019 Anemia complicating pregnancy, unspecified trimester: Secondary | ICD-10-CM | POA: Diagnosis present

## 2018-05-02 DIAGNOSIS — O1002 Pre-existing essential hypertension complicating childbirth: Secondary | ICD-10-CM | POA: Diagnosis present

## 2018-05-02 DIAGNOSIS — D252 Subserosal leiomyoma of uterus: Secondary | ICD-10-CM | POA: Diagnosis present

## 2018-05-02 DIAGNOSIS — O3413 Maternal care for benign tumor of corpus uteri, third trimester: Secondary | ICD-10-CM | POA: Diagnosis present

## 2018-05-02 DIAGNOSIS — O99844 Bariatric surgery status complicating childbirth: Secondary | ICD-10-CM | POA: Diagnosis present

## 2018-05-02 DIAGNOSIS — O9902 Anemia complicating childbirth: Secondary | ICD-10-CM | POA: Diagnosis present

## 2018-05-02 DIAGNOSIS — D649 Anemia, unspecified: Secondary | ICD-10-CM | POA: Diagnosis present

## 2018-05-02 DIAGNOSIS — O10919 Unspecified pre-existing hypertension complicating pregnancy, unspecified trimester: Secondary | ICD-10-CM | POA: Diagnosis present

## 2018-05-02 DIAGNOSIS — O34211 Maternal care for low transverse scar from previous cesarean delivery: Secondary | ICD-10-CM | POA: Diagnosis present

## 2018-05-02 DIAGNOSIS — Z98891 History of uterine scar from previous surgery: Secondary | ICD-10-CM

## 2018-05-02 DIAGNOSIS — O99214 Obesity complicating childbirth: Secondary | ICD-10-CM | POA: Diagnosis present

## 2018-05-02 DIAGNOSIS — O99824 Streptococcus B carrier state complicating childbirth: Secondary | ICD-10-CM | POA: Diagnosis present

## 2018-05-02 DIAGNOSIS — Z3A38 38 weeks gestation of pregnancy: Secondary | ICD-10-CM

## 2018-05-02 LAB — RPR: RPR Ser Ql: NONREACTIVE

## 2018-05-02 SURGERY — Surgical Case
Anesthesia: Spinal

## 2018-05-02 MED ORDER — HYDROMORPHONE HCL 1 MG/ML IJ SOLN
0.2500 mg | INTRAMUSCULAR | Status: DC | PRN
  Administered 2018-05-02 (×2): 0.25 mg via INTRAVENOUS
  Administered 2018-05-02: 0.5 mg via INTRAVENOUS

## 2018-05-02 MED ORDER — FERROUS SULFATE 325 (65 FE) MG PO TABS
325.0000 mg | ORAL_TABLET | Freq: Two times a day (BID) | ORAL | Status: DC
  Administered 2018-05-03 – 2018-05-04 (×3): 325 mg via ORAL
  Filled 2018-05-02 (×3): qty 1

## 2018-05-02 MED ORDER — DIPHENHYDRAMINE HCL 25 MG PO CAPS
25.0000 mg | ORAL_CAPSULE | ORAL | Status: DC | PRN
  Filled 2018-05-02: qty 1

## 2018-05-02 MED ORDER — SENNOSIDES-DOCUSATE SODIUM 8.6-50 MG PO TABS
2.0000 | ORAL_TABLET | ORAL | Status: DC
  Administered 2018-05-02 – 2018-05-03 (×2): 2 via ORAL
  Filled 2018-05-02 (×2): qty 2

## 2018-05-02 MED ORDER — HYDROMORPHONE HCL 1 MG/ML IJ SOLN
INTRAMUSCULAR | Status: AC
  Filled 2018-05-02: qty 1

## 2018-05-02 MED ORDER — WITCH HAZEL-GLYCERIN EX PADS: 1.0000 "application " | MEDICATED_PAD | CUTANEOUS | Status: DC | PRN

## 2018-05-02 MED ORDER — PRENATAL MULTIVITAMIN CH
1.0000 | ORAL_TABLET | Freq: Every day | ORAL | Status: DC
  Administered 2018-05-03: 1 via ORAL
  Filled 2018-05-02: qty 1

## 2018-05-02 MED ORDER — ONDANSETRON HCL 4 MG/2ML IJ SOLN: 4.0000 mg | Freq: Three times a day (TID) | INTRAMUSCULAR | Status: DC | PRN

## 2018-05-02 MED ORDER — NALBUPHINE HCL 10 MG/ML IJ SOLN: 5.0000 mg | Freq: Once | INTRAMUSCULAR | Status: DC | PRN

## 2018-05-02 MED ORDER — SODIUM CHLORIDE 0.9% FLUSH: 3.0000 mL | INTRAVENOUS | Status: DC | PRN

## 2018-05-02 MED ORDER — OXYCODONE HCL 5 MG PO TABS: 5.0000 mg | ORAL_TABLET | Freq: Once | ORAL | Status: DC | PRN

## 2018-05-02 MED ORDER — DIBUCAINE 1 % RE OINT: 1.0000 "application " | TOPICAL_OINTMENT | RECTAL | Status: DC | PRN

## 2018-05-02 MED ORDER — DIPHENHYDRAMINE HCL 25 MG PO CAPS
25.0000 mg | ORAL_CAPSULE | Freq: Four times a day (QID) | ORAL | Status: DC | PRN
  Administered 2018-05-03: 25 mg via ORAL

## 2018-05-02 MED ORDER — OXYTOCIN 40 UNITS IN NORMAL SALINE INFUSION - SIMPLE MED: 2.5000 [IU]/h | INTRAVENOUS | Status: AC

## 2018-05-02 MED ORDER — KETOROLAC TROMETHAMINE 30 MG/ML IJ SOLN: 30.0000 mg | Freq: Once | INTRAMUSCULAR | Status: DC | PRN

## 2018-05-02 MED ORDER — DIPHENHYDRAMINE HCL 50 MG/ML IJ SOLN
12.5000 mg | INTRAMUSCULAR | Status: DC | PRN
  Administered 2018-05-02: 12.5 mg via INTRAVENOUS

## 2018-05-02 MED ORDER — MORPHINE SULFATE (PF) 0.5 MG/ML IJ SOLN
INTRAMUSCULAR | Status: DC | PRN
  Administered 2018-05-02: .15 mg via INTRATHECAL

## 2018-05-02 MED ORDER — SCOPOLAMINE 1 MG/3DAYS TD PT72
MEDICATED_PATCH | TRANSDERMAL | Status: AC
  Filled 2018-05-02: qty 1

## 2018-05-02 MED ORDER — COCONUT OIL OIL: 1.0000 "application " | TOPICAL_OIL | Status: DC | PRN

## 2018-05-02 MED ORDER — LACTATED RINGERS IV SOLN
INTRAVENOUS | Status: DC
  Administered 2018-05-02 – 2018-05-03 (×2): via INTRAVENOUS

## 2018-05-02 MED ORDER — OXYCODONE HCL 5 MG/5ML PO SOLN: 5.0000 mg | Freq: Once | ORAL | Status: DC | PRN

## 2018-05-02 MED ORDER — SCOPOLAMINE 1 MG/3DAYS TD PT72
1.0000 | MEDICATED_PATCH | Freq: Once | TRANSDERMAL | Status: DC
  Administered 2018-05-02: 1.5 mg via TRANSDERMAL

## 2018-05-02 MED ORDER — NALBUPHINE HCL 10 MG/ML IJ SOLN: 5.0000 mg | INTRAMUSCULAR | Status: DC | PRN

## 2018-05-02 MED ORDER — DEXAMETHASONE SODIUM PHOSPHATE 4 MG/ML IJ SOLN
INTRAMUSCULAR | Status: AC
  Filled 2018-05-02: qty 1

## 2018-05-02 MED ORDER — SIMETHICONE 80 MG PO CHEW: 80.0000 mg | CHEWABLE_TABLET | ORAL | Status: DC | PRN

## 2018-05-02 MED ORDER — FENTANYL CITRATE (PF) 100 MCG/2ML IJ SOLN
INTRAMUSCULAR | Status: DC | PRN
  Administered 2018-05-02: 15 ug via INTRATHECAL

## 2018-05-02 MED ORDER — MEPERIDINE HCL 25 MG/ML IJ SOLN
INTRAMUSCULAR | Status: DC | PRN
  Administered 2018-05-02: 12.5 mg via INTRAVENOUS

## 2018-05-02 MED ORDER — OXYCODONE-ACETAMINOPHEN 5-325 MG PO TABS
2.0000 | ORAL_TABLET | ORAL | Status: DC | PRN
  Administered 2018-05-03 – 2018-05-04 (×2): 2 via ORAL
  Filled 2018-05-02 (×3): qty 2

## 2018-05-02 MED ORDER — FENTANYL CITRATE (PF) 100 MCG/2ML IJ SOLN
INTRAMUSCULAR | Status: AC
  Filled 2018-05-02: qty 2

## 2018-05-02 MED ORDER — PROMETHAZINE HCL 25 MG/ML IJ SOLN: 6.2500 mg | INTRAMUSCULAR | Status: DC | PRN

## 2018-05-02 MED ORDER — MEPERIDINE HCL 25 MG/ML IJ SOLN
INTRAMUSCULAR | Status: AC
  Filled 2018-05-02: qty 1

## 2018-05-02 MED ORDER — MENTHOL 3 MG MT LOZG: 1.0000 | LOZENGE | OROMUCOSAL | Status: DC | PRN

## 2018-05-02 MED ORDER — ONDANSETRON HCL 4 MG/2ML IJ SOLN
INTRAMUSCULAR | Status: DC | PRN
  Administered 2018-05-02: 4 mg via INTRAVENOUS

## 2018-05-02 MED ORDER — ONDANSETRON HCL 4 MG/2ML IJ SOLN
INTRAMUSCULAR | Status: AC
  Filled 2018-05-02: qty 2

## 2018-05-02 MED ORDER — MORPHINE SULFATE (PF) 0.5 MG/ML IJ SOLN
INTRAMUSCULAR | Status: AC
  Filled 2018-05-02: qty 10

## 2018-05-02 MED ORDER — SIMETHICONE 80 MG PO CHEW
80.0000 mg | CHEWABLE_TABLET | ORAL | Status: DC
  Administered 2018-05-02 – 2018-05-03 (×2): 80 mg via ORAL
  Filled 2018-05-02 (×2): qty 1

## 2018-05-02 MED ORDER — OXYCODONE-ACETAMINOPHEN 5-325 MG PO TABS
1.0000 | ORAL_TABLET | ORAL | Status: DC | PRN
  Administered 2018-05-03 (×2): 1 via ORAL
  Filled 2018-05-02: qty 1

## 2018-05-02 MED ORDER — BUPIVACAINE IN DEXTROSE 0.75-8.25 % IT SOLN
INTRATHECAL | Status: DC | PRN
  Administered 2018-05-02: 1.6 mL via INTRATHECAL

## 2018-05-02 MED ORDER — DEXTROSE 5 % IV SOLN
3.0000 g | INTRAVENOUS | Status: AC
  Administered 2018-05-02: 3 g via INTRAVENOUS
  Filled 2018-05-02: qty 3

## 2018-05-02 MED ORDER — OXYTOCIN 10 UNIT/ML IJ SOLN
INTRAMUSCULAR | Status: AC
  Filled 2018-05-02: qty 4

## 2018-05-02 MED ORDER — MAGNESIUM HYDROXIDE 400 MG/5ML PO SUSP: 30.0000 mL | ORAL | Status: DC | PRN

## 2018-05-02 MED ORDER — MEPERIDINE HCL 25 MG/ML IJ SOLN: 6.2500 mg | INTRAMUSCULAR | Status: DC | PRN

## 2018-05-02 MED ORDER — OXYTOCIN 10 UNIT/ML IJ SOLN
INTRAVENOUS | Status: DC | PRN
  Administered 2018-05-02: 40 [IU] via INTRAVENOUS

## 2018-05-02 MED ORDER — DIPHENHYDRAMINE HCL 50 MG/ML IJ SOLN
INTRAMUSCULAR | Status: AC
  Filled 2018-05-02: qty 1

## 2018-05-02 MED ORDER — LACTATED RINGERS IV SOLN
INTRAVENOUS | Status: DC
  Administered 2018-05-02 (×2): via INTRAVENOUS

## 2018-05-02 MED ORDER — NIFEDIPINE ER OSMOTIC RELEASE 30 MG PO TB24
90.0000 mg | ORAL_TABLET | Freq: Every day | ORAL | Status: DC
  Administered 2018-05-03 – 2018-05-04 (×2): 90 mg via ORAL
  Filled 2018-05-02 (×3): qty 3

## 2018-05-02 MED ORDER — IBUPROFEN 800 MG PO TABS
800.0000 mg | ORAL_TABLET | Freq: Three times a day (TID) | ORAL | Status: DC
  Administered 2018-05-02 – 2018-05-04 (×5): 800 mg via ORAL
  Filled 2018-05-02 (×5): qty 1

## 2018-05-02 MED ORDER — NALOXONE HCL 4 MG/10ML IJ SOLN
1.0000 ug/kg/h | INTRAVENOUS | Status: DC | PRN
  Filled 2018-05-02: qty 5

## 2018-05-02 MED ORDER — DEXAMETHASONE SODIUM PHOSPHATE 4 MG/ML IJ SOLN
INTRAMUSCULAR | Status: DC | PRN
  Administered 2018-05-02: 4 mg via INTRAVENOUS

## 2018-05-02 MED ORDER — ACETAMINOPHEN 325 MG PO TABS
650.0000 mg | ORAL_TABLET | ORAL | Status: DC | PRN
  Administered 2018-05-02 – 2018-05-03 (×2): 650 mg via ORAL
  Filled 2018-05-02 (×2): qty 2

## 2018-05-02 MED ORDER — LACTATED RINGERS IV SOLN
INTRAVENOUS | Status: DC | PRN
  Administered 2018-05-02: 10:00:00 via INTRAVENOUS

## 2018-05-02 MED ORDER — SODIUM CHLORIDE 0.9 % IR SOLN
Status: DC | PRN
  Administered 2018-05-02: 1

## 2018-05-02 MED ORDER — PHENYLEPHRINE 8 MG IN D5W 100 ML (0.08MG/ML) PREMIX OPTIME
INJECTION | INTRAVENOUS | Status: AC
  Filled 2018-05-02: qty 100

## 2018-05-02 MED ORDER — NALOXONE HCL 0.4 MG/ML IJ SOLN: 0.4000 mg | INTRAMUSCULAR | Status: DC | PRN

## 2018-05-02 MED ORDER — ZOLPIDEM TARTRATE 5 MG PO TABS: 5.0000 mg | ORAL_TABLET | Freq: Every evening | ORAL | Status: DC | PRN

## 2018-05-02 MED ORDER — PHENYLEPHRINE 8 MG IN D5W 100 ML (0.08MG/ML) PREMIX OPTIME
INJECTION | INTRAVENOUS | Status: DC | PRN
  Administered 2018-05-02: 40 ug/min via INTRAVENOUS

## 2018-05-02 MED ORDER — ENOXAPARIN SODIUM 80 MG/0.8ML ~~LOC~~ SOLN
0.5000 mg/kg | SUBCUTANEOUS | Status: DC
  Administered 2018-05-03 – 2018-05-04 (×2): 70 mg via SUBCUTANEOUS
  Filled 2018-05-02 (×3): qty 0.8

## 2018-05-02 SURGICAL SUPPLY — 45 items
ADH SKN CLS APL DERMABOND .7 (GAUZE/BANDAGES/DRESSINGS)
APL SKNCLS STERI-STRIP NONHPOA (GAUZE/BANDAGES/DRESSINGS) ×1
BARRIER ADHS 3X4 INTERCEED (GAUZE/BANDAGES/DRESSINGS) ×2 IMPLANT
BENZOIN TINCTURE PRP APPL 2/3 (GAUZE/BANDAGES/DRESSINGS) ×2 IMPLANT
BRR ADH 4X3 ABS CNTRL BYND (GAUZE/BANDAGES/DRESSINGS) ×1
CHLORAPREP W/TINT 26ML (MISCELLANEOUS) ×3 IMPLANT
CLAMP CORD UMBIL (MISCELLANEOUS) IMPLANT
CLOTH BEACON ORANGE TIMEOUT ST (SAFETY) ×3 IMPLANT
DERMABOND ADVANCED (GAUZE/BANDAGES/DRESSINGS)
DERMABOND ADVANCED .7 DNX12 (GAUZE/BANDAGES/DRESSINGS) IMPLANT
DRAPE C SECTION CLR SCREEN (DRAPES) ×3 IMPLANT
DRSG OPSITE POSTOP 4X10 (GAUZE/BANDAGES/DRESSINGS) ×3 IMPLANT
ELECT REM PT RETURN 9FT ADLT (ELECTROSURGICAL) ×3
ELECTRODE REM PT RTRN 9FT ADLT (ELECTROSURGICAL) ×1 IMPLANT
EXTRACTOR VACUUM M CUP 4 TUBE (SUCTIONS) IMPLANT
EXTRACTOR VACUUM M CUP 4' TUBE (SUCTIONS)
GLOVE BIOGEL PI IND STRL 7.0 (GLOVE) ×2 IMPLANT
GLOVE BIOGEL PI INDICATOR 7.0 (GLOVE) ×4
GLOVE SURG SS PI 6.5 STRL IVOR (GLOVE) ×3 IMPLANT
GOWN STRL REUS W/TWL LRG LVL3 (GOWN DISPOSABLE) ×6 IMPLANT
KIT ABG SYR 3ML LUER SLIP (SYRINGE) IMPLANT
NDL HYPO 25X5/8 SAFETYGLIDE (NEEDLE) IMPLANT
NEEDLE HYPO 25X5/8 SAFETYGLIDE (NEEDLE) IMPLANT
NS IRRIG 1000ML POUR BTL (IV SOLUTION) ×3 IMPLANT
PACK C SECTION WH (CUSTOM PROCEDURE TRAY) ×3 IMPLANT
PAD ABD DERMACEA PRESS 5X9 (GAUZE/BANDAGES/DRESSINGS) ×4 IMPLANT
PAD OB MATERNITY 4.3X12.25 (PERSONAL CARE ITEMS) ×3 IMPLANT
PENCIL SMOKE EVAC W/HOLSTER (ELECTROSURGICAL) ×3 IMPLANT
RTRCTR C-SECT PINK 25CM LRG (MISCELLANEOUS) ×3 IMPLANT
SUT CHROMIC 1 CTX 36 (SUTURE) IMPLANT
SUT CHROMIC 2 0 CT 1 (SUTURE) ×3 IMPLANT
SUT MON AB 4-0 PS1 27 (SUTURE) ×3 IMPLANT
SUT PLAIN 1 NONE 54 (SUTURE) IMPLANT
SUT PLAIN 2 0 (SUTURE)
SUT PLAIN 2 0 XLH (SUTURE) IMPLANT
SUT PLAIN ABS 2-0 CT1 27XMFL (SUTURE) IMPLANT
SUT VIC AB 0 CTX 36 (SUTURE) ×3
SUT VIC AB 0 CTX36XBRD ANBCTRL (SUTURE) ×1 IMPLANT
SUT VIC AB 1 CTX 36 (SUTURE) ×6
SUT VIC AB 1 CTX36XBRD ANBCTRL (SUTURE) ×2 IMPLANT
SUT VIC AB 3-0 CT1 27 (SUTURE) ×3
SUT VIC AB 3-0 CT1 TAPERPNT 27 (SUTURE) IMPLANT
TOWEL OR 17X24 6PK STRL BLUE (TOWEL DISPOSABLE) ×3 IMPLANT
TRAY FOLEY W/BAG SLVR 14FR LF (SET/KITS/TRAYS/PACK) ×3 IMPLANT
WATER STERILE IRR 1000ML POUR (IV SOLUTION) ×3 IMPLANT

## 2018-05-02 NOTE — Anesthesia Procedure Notes (Signed)
Spinal  Patient location during procedure: OB Start time: 05/02/2018 9:33 AM End time: 05/02/2018 9:38 AM Staffing Anesthesiologist: Lynda Rainwater, MD Performed: anesthesiologist  Preanesthetic Checklist Completed: patient identified, surgical consent, pre-op evaluation, timeout performed, IV checked, risks and benefits discussed and monitors and equipment checked Spinal Block Patient position: sitting Prep: site prepped and draped and DuraPrep Patient monitoring: heart rate, cardiac monitor, continuous pulse ox and blood pressure Approach: midline Location: L3-4 Injection technique: single-shot Needle Needle type: Quincke  Needle gauge: 22 G Needle length: 15 cm Assessment Sensory level: T4

## 2018-05-02 NOTE — Transfer of Care (Signed)
Immediate Anesthesia Transfer of Care Note  Patient: Tracey Alvarez  Procedure(s) Performed: CESAREAN SECTION (N/A )  Patient Location: PACU  Anesthesia Type:Spinal  Level of Consciousness: awake, alert  and patient cooperative  Airway & Oxygen Therapy: Patient Spontanous Breathing  Post-op Assessment: Report given to RN and Post -op Vital signs reviewed and stable  Post vital signs: Reviewed and stable  Last Vitals:  Vitals Value Taken Time  BP 143/70 05/02/2018 11:50 AM  Temp    Pulse 93 05/02/2018 11:51 AM  Resp 21 05/02/2018 11:51 AM  SpO2 99 % 05/02/2018 11:51 AM  Vitals shown include unvalidated device data.  Last Pain:  Vitals:   05/02/18 0751  TempSrc: Oral  PainSc: 0-No pain         Complications: No apparent anesthesia complications

## 2018-05-02 NOTE — Progress Notes (Signed)
MOB was referred for history of depression. * Referral screened out by Clinical Social Worker because none of the following criteria appear to apply: ~ History of anxiety/depression during this pregnancy, or of post-partum depression following prior delivery. Per OB records review, no concerns of depression noted in OB records.  ~ Diagnosis of anxiety and/or depression within last 3 years. Per chart review, MOB's depression dates back to 2012.  OR * MOB's symptoms currently being treated with medication and/or therapy. Please contact the Clinical Social Worker if needs arise, by [facility] request, or if MOB scores greater than 9/yes to question 10 on Edinburgh Postpartum Depression Screen.  Abundio Miu, [place] Worker [facility] Cell#: [phone]

## 2018-05-02 NOTE — H&P (Addendum)
Tracey Alvarez is a 33 y.o. female, G2P1001 at 38+0 weeks, presenting for scheduled repeat Cesarean section with chronic hypertension on procardia.  Patient Active Problem List   Diagnosis Date Noted  . Elevated glucose tolerance test 11/17/2017  . Anemia affecting pregnancy 11/17/2017  . Chronic hypertension affecting pregnancy 11/16/2017  . Gestational diabetes 10/09/2012  . Uterine size date discrepancy 08/11/2012  . Hx of laparoscopic partial gastrectomy 08/11/2012    History of present pregnancy: Patient entered care at 11 weeks.   EDC of 05/16/2018 was established by LMP, c/w 10 wk [place].   Anatomy scan:  20+6 weeks, with normal findings and a posterior placenta.   Additional [place] evaluations:  Growth q 4 weeks due to [facility], BPP @637 +3wks 8/8, AFI 15.35, EFW at 36+4 6#5oz (40th%ile) Significant prenatal events:  Anemia, BV, PreE labs normal   Last evaluation:  ROB 04/23/18  OB History    Gravida  2   Para  1   Term  1   Preterm      AB      Living  1     SAB      TAB      Ectopic      Multiple      Live Births  1          Past Medical History:  Diagnosis Date  . Anemia 2012  . Depression 2012   POST we loss SURGERY; MEDS X 2 WEEKS, had ppd  . Diabetes mellitus without complication ([place])   . Gestational diabetes    diet controlled  . Infection 2005   UIT  . Infection    YEAST X 1  . PCOS (polycystic ovarian syndrome)   . Pneumonia    AS TEEN  . Pregnancy induced hypertension    Past Surgical History:  Procedure Laterality Date  . CESAREAN SECTION N/A 10/16/2012   Procedure: Primary Cesarean Section Delivery Baby Girl @ 2947, Apgars 8/9;  Surgeon: Maeola Sarah. Landry Mellow, MD;  Location: [facility];  Service: Obstetrics;  Laterality: N/A;  . TONSILLECTOMY  AGE 33  . VERTICAL SLEEVE GASGTRECTOMY  2012   Family History: family history includes Anemia in her mother; Aneurysm in her maternal uncle; Arthritis in her maternal grandmother; Diabetes in her maternal aunt  and maternal grandmother; Heart disease in her maternal grandfather; Hyperlipidemia in her maternal grandfather and maternal grandmother; Hypertension in her maternal grandmother; Lupus in her cousin and maternal aunt; Seizures in her brother; Stroke in her maternal grandfather and maternal grandmother. Social History:  reports that she has never smoked. She has never used smokeless tobacco. She reports previous alcohol use. She reports that she does not use drugs.   Prenatal Transfer Tool  Maternal Diabetes: No Genetic Screening: Normal Maternal Ultrasounds/Referrals: Normal Fetal Ultrasounds or other Referrals:  None Maternal Substance Abuse:  No Significant Maternal Medications:  Meds include: Other:  Procardia, baby ASA, Fe Significant Maternal Lab Results: Lab values include: Group B Strep positive, Other:  Rubella NI  TDAP yes Flu yes  ROS:  Deferred until arrival  Allergies  Allergen Reactions  . Shellfish Allergy Shortness Of Breath       There were no vitals taken for this visit. Exam: deferred until arrival Chest : clear to auscultation bilaterally Heart : s1, s2, RRR Abd: soft, non tender, gravid Pelvic: deferred Ext: warm and well perfused. 1+ edema bilaterally in lower extremities.   FHR: 150 UCs:  None   Prenatal labs: ABO, Rh: --/--/O  POS (02/13 5732) Antibody: NEG (02/13 0955) Rubella:  Nonimmune (08/06 0000) RPR: Non Reactive (02/13 0942)  HBsAg: Negative (08/06 0000)  HIV: Non-reactive (08/06 0000)  GBS:  POSITIVE Sickle cell/Hgb electrophoresis:  AA GC:  neg Chlamydia:  neg Genetic screenings:  Low risk Panorama, female Glucola:  3* WNL Hgb 10.1 at NOB, 9.3 at 28 weeks    Assessment: 33 y.o., G2P1001 at 38+0 weeks Repeat C/S CHTN  Plan: Admit to [facility] per consult with [facility] Cesarean section [facility] orders   [name], CNM 05/02/2018, 7:26 AM

## 2018-05-02 NOTE — Anesthesia Postprocedure Evaluation (Signed)
Anesthesia Post Note  Patient: Tracey Alvarez  Procedure(s) Performed: CESAREAN SECTION (N/A )     Patient location during evaluation: PACU Anesthesia Type: Spinal Level of consciousness: oriented and awake and alert Pain management: pain level controlled Vital Signs Assessment: post-procedure vital signs reviewed and stable Respiratory status: spontaneous breathing and respiratory function stable Cardiovascular status: blood pressure returned to baseline and stable Postop Assessment: no headache, no backache and no apparent nausea or vomiting Anesthetic complications: no    Last Vitals:  Vitals:   05/02/18 1245 05/02/18 1306  BP: (!) 134/93 (!) 148/90  Pulse: 96 94  Resp: 19 18  Temp:  37.2 C  SpO2: 99% 99%    Last Pain:  Vitals:   05/02/18 1306  TempSrc: Oral  PainSc:    Pain Goal:    LLE Motor Response: Purposeful movement (05/02/18 1245) LLE Sensation: Tingling (05/02/18 1245) RLE Motor Response: Purposeful movement (05/02/18 1245) RLE Sensation: Tingling (05/02/18 1245)     Epidural/Spinal Function Cutaneous sensation: Tingles (05/02/18 1245), Patient able to flex knees: Yes (05/02/18 1245), Patient able to lift hips off bed: No (05/02/18 1245), Back pain beyond tenderness at insertion site: No (05/02/18 1245), Progressively worsening motor and/or sensory loss: No (05/02/18 1245), Bowel and/or bladder incontinence post epidural: No (05/02/18 1245)  Lynda Rainwater

## 2018-05-02 NOTE — Anesthesia Postprocedure Evaluation (Signed)
Anesthesia Post Note  Patient: Tracey Alvarez  Procedure(s) Performed: CESAREAN SECTION (N/A )     Patient location during evaluation: Mother Baby Anesthesia Type: Spinal Level of consciousness: awake and alert and oriented Pain management: satisfactory to patient Vital Signs Assessment: post-procedure vital signs reviewed and stable Respiratory status: respiratory function stable and spontaneous breathing Cardiovascular status: blood pressure returned to baseline Postop Assessment: no headache, no backache, spinal receding, patient able to bend at knees and adequate PO intake Anesthetic complications: no    Last Vitals:  Vitals:   05/02/18 1410 05/02/18 1554  BP: (!) 148/83 (!) 129/54  Pulse: 77 84  Resp: 18 20  Temp: 36.7 C 37.3 C  SpO2: 98% 98%    Last Pain:  Vitals:   05/02/18 1554  TempSrc: Oral  PainSc: 3    Pain Goal:                   [name]

## 2018-05-02 NOTE — Op Note (Addendum)
Patient: Tracey Alvarez DOB: 08-12-1985 MRN: [id number]      DATE OF SURGERY:  05/02/2018  PREOP DIAGNOSIS:  1. [redacted] week EGA IUP 2. Chronic hypertension on Procardia.  3.  History of 1 prior cesarean section, desiring a repeat Cesarean delivery.  4.  Morbid obesity with BMI of 46.     POSTOP DIAGNOSIS: Same as above and omental adhesions to the lower uterine segment.   PROCEDURES: Repeat low uterine segment transverse cesarean section via Pfannenstiel incision, lysis of adhesions.   SURGEON: Dr.  Johny Sax Alesia Richards  ASSISTANT: Tracey Alvarez, CNM  ANESTHESIA: Spinal  COMPLICATIONS: None  FINDINGS: Viable female infant in cephalic presentation, DOP, weight 7lbs 0.7 oz, Apgar scores of 8 and 9. Uterus with a small 1cm subserosal fibroid in mid anterior portion of uterus.  Normal fallopian tubes and ovaries bilaterally.    EBL:  710 cc  IV FLUID:  2700 cc LR   URINE OUTPUT: 250 cc clear urine  INDICATIONS:  33 y/o P2 who presented for a repeat C/S for above diagnosis. She was consented for the procedure after explaining risks benefits and alternatives of the procedure.    PROCEDURE:   Informed consent was obtained from the patient to undergo the procedure. She was taken to the operating room where her spinal anesthesia was found to be adequate.  Traxis pannus lifter was used to expose the lower abdomen.  She was prepped and draped in the usual sterile fashion and a Foley catheter was placed. She received 3 g of IV Ancef preoperatively. A Pfannenstiel incision was made with the scalpel over the old incision and the incision extended through the subcutaneous layer and also the fascia with the bovie. Small perforators in the subcutaneous layer were contained with the Bovie. The fascia was nicked in the midline and then was further separated from the rectus muscles bilaterally using Mayo scissors. Kochers were placed inferiorly and then superiorly to allow further separation of fascia from the  rectus muscles.  The peritoneal cavity was entered bluntly with the fingers. The Alexis retractor was placed in. Omental adhesions were noted on the lower uterine segment and these were gently lysed bluntly and with Metzenbaum scissors and pushed down to expose the incision area.  The bladder flap was created using Metzenbaum scissors.   The uterus was incised with a scalpel and the incision extended bluntly bilaterally with fingers and bandage scissors. Membranes were ruptured.  Copious clear amniotic fluid was noted and the head then the rest of the body was then delivered with abdominal pressure.  She delivered a viable female infant, apgar scores 8, 9.  The edges of the uterus was grasped with Allis clamps. The cord was clamped and cut after 1 minute. Cord blood was collected.    The uterus was not exteriorized.  The placenta was delivered with gentle traction on the umbilical cord.  The uterus was cleared of clots and debris with a lap.  The uterine incision was closed with #1 Vicryl in a running locked stitch. An imbricating layer of the same stitch was placed over the initial closure.  A small area that bled on the left side was contained with figure of 8 stitch.  Irrigation was applied and suctioned out. Excellent hemostasis was noted over the incision and intercede was placed over the incision and where omental adhesions had been present before.  The muscles and peritoneum were then reapproximated using chromic suture.  Fascia was closed using 0 PDS in a running  stitch. The subcutaneous layer was irrigated and suctioned out. Small perforators were contained with the bovie.  The subcutaneous layer was closed using 1-0 plain in interrupted stitches. The skin was closed using 4-0 Monocryl. Benzocaine and steri strips were applied.  Honeycomb and pressure dressings were then applied. The patient was then cleaned and she was taken to the recovery room with her baby in stable conditions.  Plan is to give  postpartum lovenox while admtited in hospital.    SPECIMEN: Placenta to labor and delivery, umbilical cord blood to lab.   DISPOSITION: TO PACU, STABLE.   Dr. Alesia Richards.  05/02/2018.

## 2018-05-02 NOTE — Addendum Note (Signed)
Addendum  created 05/02/18 1758 by Flossie Dibble, CRNA   Clinical Note Signed

## 2018-05-02 NOTE — Brief Op Note (Signed)
05/02/2018  11:36 AM  PATIENT:  Tracey Alvarez  33 y.o. female  PRE-OPERATIVE DIAGNOSIS:  1.  Chronic Hypertension on Procardia. [redacted] week EGA IUP.    3.  One prior Cesarean section and desiring a repeat Cesarean delivery.    3.POST-OPERATIVE DIAGNOSIS: Same as above.   PROCEDURE:  Procedure(s): CESAREAN SECTION (N/A) REPEAT  SURGEON:  Surgeon(s) and Role:    * Waymon Amato, MD - Primary  ASSISTANTS: [name], CNM.    ANESTHESIA:   spinal  EBL:  710 mL   BLOOD ADMINISTERED:none  DRAINS: none   LOCAL MEDICATIONS USED:  NONE  SPECIMEN:  Source of Specimen:  Cord blood.   DISPOSITION OF SPECIMEN:  Cord blood to lab.  Placenta to pathology.   COUNTS:  YES  TOURNIQUET:  * No tourniquets in log *  DICTATION: .Note written in [place]: Admit to inpatient   PATIENT DISPOSITION:  PACU - hemodynamically stable.   Delay start of Pharmacological VTE agent (>24hrs) due to surgical blood loss or risk of bleeding: no  Dr. Waymon Amato.

## 2018-05-02 NOTE — Anesthesia Preprocedure Evaluation (Signed)
Anesthesia Evaluation  Patient identified by MRN, date of birth, ID band Patient awake    Reviewed: Allergy & Precautions, H&P , NPO status , Patient's Chart, lab work & pertinent test results  Airway Mallampati: III  TM Distance: >3 FB Neck ROM: Full    Dental no notable dental hx. (+) Teeth Intact   Pulmonary neg pulmonary ROS, pneumonia, resolved,    Pulmonary exam normal breath sounds clear to auscultation       Cardiovascular hypertension, Pt. on medications negative cardio ROS Normal cardiovascular exam Rhythm:Regular Rate:Normal     Neuro/Psych PSYCHIATRIC DISORDERS Depression negative neurological ROS  negative psych ROS   GI/Hepatic negative GI ROS, Neg liver ROS, GERD  Medicated and Controlled,S/P Gastric Bypass   Endo/Other  negative endocrine ROSdiabetes, Well Controlled, GestationalMorbid obesity  Renal/GU negative Renal ROS  negative genitourinary   Musculoskeletal negative musculoskeletal ROS (+)   Abdominal (+) + obese,   Peds negative pediatric ROS (+)  Hematology negative hematology ROS (+)   Anesthesia Other Findings   Reproductive/Obstetrics negative OB ROS (+) Pregnancy                             Anesthesia Physical  Anesthesia Plan  ASA: III  Anesthesia Plan: Spinal   Post-op Pain Management:    Induction:   PONV Risk Score and Plan: 2 and Treatment may vary due to age or medical condition  Airway Management Planned: Natural Airway  Additional Equipment:   Intra-op Plan:   Post-operative Plan:   Informed Consent: I have reviewed the patients History and Physical, chart, labs and discussed the procedure including the risks, benefits and alternatives for the proposed anesthesia with the patient or authorized representative who has indicated his/her understanding and acceptance.     Dental advisory given  Plan Discussed with:  Anesthesiologist  Anesthesia Plan Comments:         Anesthesia Quick Evaluation

## 2018-05-02 NOTE — Lactation Note (Signed)
This note was copied from a baby's chart. Lactation Consultation Note  Patient Name: Tracey Alvarez DTHYH'O Date: 05/02/2018 Reason for consult: Initial assessment;Early term 37-38.6wks  Laparoscopic partial gastrectomy 2012 and Mom BF first baby for 7 months. Chronic HTN, GDM.  Visited with P2 Mom of ET infant at 35 hrs old.  Mom an experienced breastfeeder for 7 months with her 34 yr old daughter.  Assisted with latching baby in football hold on left side.  Baby latched deeply and Mom comfortable feeling a tug. Reviewed breast massage and hand expression, colostrum drops expressed.  Encouraged Mom to keep baby STS as much as possible and breastfeed when baby cues he is hungry.  Reviewed signs of hunger.    Lactation brochure provided to Mom.  Mom aware of IP and OP lactation support available.  Encouraged to call prn for assistance.  Consult Status Consult Status: Follow-up Date: 05/03/18 Follow-up type: In-patient    Broadus John 05/02/2018, 7:07 PM

## 2018-05-02 NOTE — Interval H&P Note (Signed)
History and Physical Interval Note:  05/02/2018 9:15 AM  Tracey Alvarez  has presented today for surgery, with the diagnosis of Chronic Hyperension  The various methods of treatment have been discussed with the patient and family. After consideration of risks, benefits and other options for treatment, the patient has consented to  Procedure(s): CESAREAN SECTION (N/A) (REPEAT) as a surgical intervention .  The patient's history has been reviewed, patient examined, no change in status, stable for surgery.  I have reviewed the patient's chart and labs.  Questions were answered to the patient's satisfaction.     Alinda Dooms, MD.

## 2018-05-03 ENCOUNTER — Encounter (HOSPITAL_COMMUNITY): Payer: Self-pay | Admitting: Obstetrics & Gynecology

## 2018-05-03 LAB — CBC
HCT: 23 % — ABNORMAL LOW (ref 36.0–46.0)
Hemoglobin: 7.5 g/dL — ABNORMAL LOW (ref 12.0–15.0)
MCH: 25.5 pg — ABNORMAL LOW (ref 26.0–34.0)
MCHC: 32.6 g/dL (ref 30.0–36.0)
MCV: 78.2 fL — ABNORMAL LOW (ref 80.0–100.0)
Platelets: 124 10*3/uL — ABNORMAL LOW (ref 150–400)
RBC: 2.94 MIL/uL — ABNORMAL LOW (ref 3.87–5.11)
RDW: 15 % (ref 11.5–15.5)
WBC: 12.2 10*3/uL — ABNORMAL HIGH (ref 4.0–10.5)
nRBC: 0 % (ref 0.0–0.2)

## 2018-05-03 LAB — PREPARE RBC (CROSSMATCH)

## 2018-05-03 LAB — CREATININE, SERUM
CREATININE: 0.87 mg/dL (ref 0.44–1.00)
GFR calc Af Amer: >60 mL/min (ref >60)
GFR calc non Af Amer: >60 mL/min (ref >60)

## 2018-05-03 LAB — BIRTH TISSUE RECOVERY COLLECTION (PLACENTA DONATION)

## 2018-05-03 MED ORDER — DIPHENHYDRAMINE HCL 25 MG PO CAPS
25.0000 mg | ORAL_CAPSULE | Freq: Once | ORAL | Status: AC
  Administered 2018-05-03: 25 mg via ORAL
  Filled 2018-05-03: qty 1

## 2018-05-03 MED ORDER — SODIUM CHLORIDE 0.9% IV SOLUTION: Freq: Once | INTRAVENOUS | Status: DC

## 2018-05-03 MED ORDER — ACETAMINOPHEN 325 MG PO TABS
650.0000 mg | ORAL_TABLET | Freq: Once | ORAL | Status: DC
  Filled 2018-05-03: qty 2

## 2018-05-03 NOTE — Progress Notes (Addendum)
ALFRIEDA Alvarez [id number]   Ms. Tracey Alvarez is 33 y.o.G2P2, delivered at [redacted]w[redacted]d Subjective: Postpartum Day 1: Repeat C/S with [facility] Patient up ad lib, reports no syncope or dizziness. Mobility is very good. Pain is minimal compared to prior C/s. Mild incisional pain only. Has only used ibu/acet. Feeding:  Breast, baby is latching well Contraceptive plan:  NFP. She has bought a BBT thermometer and has been studying natural family planning. Does not want anything placed in her body. We discussed LAM and ovulation.  Objective: Temp:  [98 F (36.7 C)-99.1 F (37.3 C)] 98.6 F (37 C) (02/15 0606) Pulse Rate:  [77-97] 87 (02/15 0606) Resp:  [12-20] 18 (02/15 0606) BP: (106-148)/(53-93) 135/60 (02/15 0606) SpO2:  [98 %-100 %] 99 % (02/15 0222)  CBC Latest Ref Rng & Units 05/03/2018 05/01/2018 03/04/2018  WBC 4.0 - 10.5 K/uL 12.2(H) 11.5(H) 12.6(H)  Hemoglobin 12.0 - 15.0 g/dL 7.5(L) 9.8(L) 9.0(L)  Hematocrit 36.0 - 46.0 % 23.0(L) 30.3(L) 28.3(L)  Platelets 150 - 400 K/uL 124(L) 145(L) 164     Physical Exam:  General: alert, cooperative, no distress and moderately obese Lochia: scant Uterine Fundus: firm Abdomen:  + bowel sounds, diminished Incision: Honeycomb dressing CDI DVT Evaluation: No evidence of DVT seen on physical exam. JP drain:   n/a  Assessment/Plan: Status post cesarean delivery, day 1. Desires early discharge on day 1. Hemoglobin has dropped 2.3 points but patient is asymptomatic and dose not desire blood transfusion. Will recheck CBC in a.m. On ferrous sulfate bid. Stable Continue current care. Breastfeeding  Desires early discharge on Sunday.    [name] MSN, CNM 05/03/2018, 8:20 AM   I saw and examined patient and agree with above findings, assessment and plan.  Patient now desires a blood transfusion for her anemia.  She has a long history of anemia even during pregnancy that minimally responded to iron tablets. She has a history of gastric sleeve surgery.   I agree blood transfusion is a good idea given her history, low hgb, even though she is asymptomatic.  We discussed risks, benefits and alternatives of blood transfusion including risks of infection, transfusion reactions and allergic reactions.   She expressed understanding and desired to proceed with 2 units PRBC transfusion.   -I discussed with patient risks, benefits and alternatives of circumcision including risks of bleeding, infection, damage to organs and need for further procedures.  All her questions were answered and she consented for the procedure to be done on her son. Dr. Alesia Richards.  05/03/2018 1041am.

## 2018-05-03 NOTE — Lactation Note (Addendum)
This note was copied from a baby's chart. Lactation Consultation Note  Patient Name: Tracey Alvarez Date: 05/03/2018 Reason for consult: Follow-up assessment;Early term 54-38.6wks  Visited with P2 Mom of ET infant at 74 hrs old and at 5% weight loss.  Baby has not been feeding well today, and was noted to be jittery.  Baby received 10 ml of formula at 1300, but hasn't latched since.  CBG pending.    Mom's hgb dropped by 2 pts to 7.5, and Mom receiving her 2nd unit of blood.  RN assisted Mom to latch baby in football hold while in chair getting blood.  Baby noted to be latched shallow when LC observed.  After feeding for 5 mins, took baby off and assisted Mom to latch baby deeper.  Unwrapped baby to provide STS stimulation while breastfeeding.  Hand expressed small drop of colostrum.  While sandwiching breast, and waiting for a wider open mouth, baby able to latch on deeper.  Mom stated this latch felt much better, and it didn't hurt.  Watched baby for 15 mins, unable to identify swallows though baby did have deep jaw extensions.  Talked to Mom about regularly supplementing baby due to her low hgb, and baby's sleepiness.  Mom agreeable.  Set up DEBP at chairside with instructions on pumping after baby breastfeeds, on initiation setting for 15 mins.  Instructed Mom on how to disassemble pump parts, wash, rinse and air dry parts in separate basin.    Plan- 1- Keep baby STS as much as possible 2- Watch for feeding cues, and breastfeed baby asking for assistance prn 3- Offer 10-15 ml of EBM+/formula by bottle after breast 4- Pump both breasts 15 mins, adding breast massage and hand expression.  Feeding Feeding Type: Breast Fed  LATCH Score Latch: Grasps breast easily, tongue down, lips flanged, rhythmical sucking.  Audible Swallowing: A few with stimulation  Type of Nipple: Everted at rest and after stimulation  Comfort (Breast/Nipple): Soft / non-tender  Hold (Positioning):  Assistance needed to correctly position infant at breast and maintain latch.  LATCH Score: 8  Interventions Interventions: Breast feeding basics reviewed;Assisted with latch;Skin to skin;Breast massage;Hand express;Support pillows;Adjust position;Breast compression;Position options;DEBP  Lactation Tools Discussed/Used Tools: Pump Breast pump type: Double-Electric Breast Pump Pump Review: Setup, frequency, and cleaning;Milk Storage Initiated by:: Cipriano Mile RN IBCLC Date initiated:: 05/03/18   Consult Status Consult Status: Follow-up Date: 05/04/18 Follow-up type: In-patient    Tracey Alvarez 05/03/2018, 5:46 PM

## 2018-05-04 LAB — TYPE AND SCREEN
ABO/RH(D): O POS
Antibody Screen: NEGATIVE
UNIT DIVISION: 0
UNIT DIVISION: 0
Unit division: 0
Unit division: 0

## 2018-05-04 LAB — BPAM RBC
BLOOD PRODUCT EXPIRATION DATE: 202003012359
Blood Product Expiration Date: 202003072359
Blood Product Expiration Date: 202003102359
Blood Product Expiration Date: 202003102359
ISSUE DATE / TIME: 202002151129
ISSUE DATE / TIME: 202002151514
UNIT TYPE AND RH: 5100
Unit Type and Rh: 5100
Unit Type and Rh: 9500
Unit Type and Rh: 9500

## 2018-05-04 LAB — CBC
HCT: 28.5 % — ABNORMAL LOW (ref 36.0–46.0)
HEMATOCRIT: 28.4 % — AB (ref 36.0–46.0)
Hemoglobin: 9 g/dL — ABNORMAL LOW (ref 12.0–15.0)
Hemoglobin: 9.3 g/dL — ABNORMAL LOW (ref 12.0–15.0)
MCH: 25.6 pg — ABNORMAL LOW (ref 26.0–34.0)
MCH: 26.3 pg (ref 26.0–34.0)
MCHC: 31.7 g/dL (ref 30.0–36.0)
MCHC: 32.6 g/dL (ref 30.0–36.0)
MCV: 80.5 fL (ref 80.0–100.0)
MCV: 80.9 fL (ref 80.0–100.0)
Platelets: 128 10*3/uL — ABNORMAL LOW (ref 150–400)
Platelets: 138 10*3/uL — ABNORMAL LOW (ref 150–400)
RBC: 3.51 MIL/uL — ABNORMAL LOW (ref 3.87–5.11)
RBC: 3.54 MIL/uL — ABNORMAL LOW (ref 3.87–5.11)
RDW: 15.1 % (ref 11.5–15.5)
RDW: 15.2 % (ref 11.5–15.5)
WBC: 10.9 10*3/uL — ABNORMAL HIGH (ref 4.0–10.5)
WBC: 11.6 10*3/uL — ABNORMAL HIGH (ref 4.0–10.5)
nRBC: 0 % (ref 0.0–0.2)
nRBC: 0 % (ref 0.0–0.2)

## 2018-05-04 MED ORDER — FERROUS SULFATE 325 (65 FE) MG PO TABS: 325.0000 mg | ORAL_TABLET | Freq: Every day | ORAL | 0 refills | Status: DC

## 2018-05-04 MED ORDER — NIFEDIPINE ER OSMOTIC RELEASE 90 MG PO TB24: 90.0000 mg | ORAL_TABLET | Freq: Every day | ORAL | 3 refills | Status: DC

## 2018-05-04 MED ORDER — IBUPROFEN 800 MG PO TABS: 800.0000 mg | ORAL_TABLET | Freq: Three times a day (TID) | ORAL | 0 refills | Status: DC

## 2018-05-04 MED ORDER — OXYCODONE HCL 5 MG PO TABS: 10.0000 mg | ORAL_TABLET | Freq: Four times a day (QID) | ORAL | 0 refills | Status: AC | PRN

## 2018-05-04 NOTE — Lactation Note (Signed)
This note was copied from a baby's chart. Lactation Consultation Note  Patient Name: Tracey Alvarez GGEZM'O Date: 05/04/2018 Reason for consult: Follow-up assessment;Early term 53-38.6wks Mom reports she is putting baby to breast with cues and supplementing after due to low blood sugars.  Discussed milk coming to volume and the prevention and treatment of engorgement.  She has a DEBP at home.  Mom plans on discontinuing formula after milk comes to volume.  Lactation outpatient services and support reviewed and encouraged prn.  Maternal Data    Feeding    LATCH Score                   Interventions    Lactation Tools Discussed/Used     Consult Status Consult Status: Complete Follow-up type: Call as needed    Tracey Alvarez 05/04/2018, 10:25 AM

## 2018-05-04 NOTE — Discharge Summary (Signed)
OB Discharge Summary     Patient Name: Tracey Alvarez DOB: [birth year] MRN: [id number]  Date of admission: 05/02/2018 Delivering MD: Waymon Amato   Date of discharge: 05/04/2018  Admitting diagnosis: Chronic Hyperension Intrauterine pregnancy: [redacted]w[redacted]d     Secondary diagnosis:  Principal Problem:   Chronic hypertension affecting pregnancy Active Problems:   Status post cesarean delivery   Anemia affecting pregnancy  Additional problems: none     Discharge diagnosis: Term Pregnancy Delivered and CHTN                                                                                                Post partum procedures:blood transfusion  Augmentation: n/a  Complications: None  Hospital course:  Sceduled C/S   33 y.o. yo G2P2002 at [redacted]w[redacted]d was admitted to the hospital 05/02/2018 for scheduled cesarean section with the following indication:Elective Repeat.  Membrane Rupture Time/Date: 10:17 AM ,05/02/2018   Patient delivered a Viable infant.05/02/2018  Details of operation can be found in separate operative note.  Pateint had an uncomplicated postpartum course.  She is ambulating, tolerating a regular diet, passing flatus, and urinating well. Patient is discharged home in stable condition on  05/04/18         Physical exam  Vitals:   05/03/18 1550 05/03/18 1945 05/03/18 2201 05/04/18 0527  BP: 136/75 135/73 130/76 129/80  Pulse: 85 97  93  Resp: 18 18 18 18   Temp: 98.8 F (37.1 C) 98.5 F (36.9 C) 98.5 F (36.9 C) 98.1 F (36.7 C)  TempSrc: Oral Oral Oral Oral  SpO2: 100% 99%    Weight:      Height:       General: alert, cooperative and no distress Lochia: appropriate Uterine Fundus: firm Incision: Healing well with no significant drainage DVT Evaluation: No evidence of DVT seen on physical exam. Labs: Lab Results  Component Value Date   WBC 10.9 (H) 05/04/2018   HGB 9.3 (L) 05/04/2018   HCT 28.5 (L) 05/04/2018   MCV 80.5 05/04/2018   PLT 138 (L) 05/04/2018   CMP  Latest Ref Rng & Units 05/03/2018  Glucose 70 - 99 mg/dL -  BUN 6 - 20 mg/dL -  Creatinine 0.44 - 1.00 mg/dL 0.87  Sodium 135 - 145 mmol/L -  Potassium 3.5 - 5.1 mmol/L -  Chloride 98 - 111 mmol/L -  CO2 22 - 32 mmol/L -  Calcium 8.9 - 10.3 mg/dL -  Total Protein 6.5 - 8.1 g/dL -  Total Bilirubin 0.3 - 1.2 mg/dL -  Alkaline Phos 38 - 126 U/L -  AST 15 - 41 U/L -  ALT 0 - 44 U/L -    Discharge instruction: per After Visit Summary and "Baby and Me Booklet".  After visit meds:  Procardia 90 XL daily- new prescription sent. Oxycodone for incisional pain, 4 day prescription given. Will take Fusion iron once daily and ferrous sulfate once daily.  Diet: routine diet  Activity: Advance as tolerated. Pelvic rest for 6 weeks.   Outpatient follow up:Will come in at 1 week for blood pressure check and incision  check, then at 6 weeks for regular postpartum visit.  Follow up Appt:No future appointments. Follow up Visit:No follow-ups on file.  Postpartum contraception: Natural Family Planning  Newborn Data: Live born female  Birth Weight: 7 lb 0.7 oz (3195 g) APGAR: 8, 9  Newborn Delivery   Birth date/time:  05/02/2018 10:18:00 Delivery type:  C-Section, Low Transverse Trial of labor:  No C-section categorization:  Repeat     Baby Feeding: Breast Disposition:home with mother   05/04/2018 [name], CNM

## 2019-05-05 ENCOUNTER — Encounter (HOSPITAL_BASED_OUTPATIENT_CLINIC_OR_DEPARTMENT_OTHER): Payer: Self-pay

## 2019-05-05 ENCOUNTER — Emergency Department (HOSPITAL_BASED_OUTPATIENT_CLINIC_OR_DEPARTMENT_OTHER)
Admission: EM | Admit: 2019-05-05 | Discharge: 2019-05-05 | Disposition: A | Discharge status: Home / Self Care | Payer: BLUE CROSS/BLUE SHIELD | Attending: Emergency Medicine | Admitting: Emergency Medicine

## 2019-05-05 ENCOUNTER — Other Ambulatory Visit: Payer: Self-pay

## 2019-05-05 ENCOUNTER — Emergency Department (HOSPITAL_BASED_OUTPATIENT_CLINIC_OR_DEPARTMENT_OTHER): Payer: BLUE CROSS/BLUE SHIELD

## 2019-05-05 DIAGNOSIS — R202 Paresthesia of skin: Secondary | ICD-10-CM | POA: Diagnosis present

## 2019-05-05 DIAGNOSIS — B350 Tinea barbae and tinea capitis: Secondary | ICD-10-CM | POA: Diagnosis not present

## 2019-05-05 DIAGNOSIS — Z79899 Other long term (current) drug therapy: Secondary | ICD-10-CM | POA: Diagnosis not present

## 2019-05-05 DIAGNOSIS — G8929 Other chronic pain: Secondary | ICD-10-CM | POA: Diagnosis not present

## 2019-05-05 DIAGNOSIS — R519 Headache, unspecified: Secondary | ICD-10-CM | POA: Insufficient documentation

## 2019-05-05 LAB — CBC WITH DIFFERENTIAL/PLATELET
Abs Immature Granulocytes: 0.04 10*3/uL (ref 0.00–0.07)
Basophils Absolute: 0 10*3/uL (ref 0.0–0.1)
Basophils Relative: 0 %
Eosinophils Absolute: 0.1 10*3/uL (ref 0.0–0.5)
Eosinophils Relative: 1 %
HCT: 41.8 % (ref 36.0–46.0)
Hemoglobin: 13 g/dL (ref 12.0–15.0)
Immature Granulocytes: 0 %
Lymphocytes Relative: 26 %
Lymphs Abs: 3.4 10*3/uL (ref 0.7–4.0)
MCH: 24.9 pg — ABNORMAL LOW (ref 26.0–34.0)
MCHC: 31.1 g/dL (ref 30.0–36.0)
MCV: 80.1 fL (ref 80.0–100.0)
Monocytes Absolute: 0.6 10*3/uL (ref 0.1–1.0)
Monocytes Relative: 5 %
Neutro Abs: 9.2 10*3/uL — ABNORMAL HIGH (ref 1.7–7.7)
Neutrophils Relative %: 68 %
Platelets: 238 10*3/uL (ref 150–400)
RBC: 5.22 MIL/uL — ABNORMAL HIGH (ref 3.87–5.11)
RDW: 15.5 % (ref 11.5–15.5)
WBC: 13.4 10*3/uL — ABNORMAL HIGH (ref 4.0–10.5)
nRBC: 0 % (ref 0.0–0.2)

## 2019-05-05 LAB — BASIC METABOLIC PANEL
Anion gap: 9 (ref 5–15)
BUN: 15 mg/dL (ref 6–20)
CO2: 26 mmol/L (ref 22–32)
Calcium: 9.5 mg/dL (ref 8.9–10.3)
Chloride: 101 mmol/L (ref 98–111)
Creatinine, Ser: 0.85 mg/dL (ref 0.44–1.00)
GFR calc Af Amer: >60 mL/min (ref >60)
GFR calc non Af Amer: >60 mL/min (ref >60)
Glucose, Bld: 88 mg/dL (ref 70–99)
Potassium: 3.5 mmol/L (ref 3.5–5.1)
Sodium: 136 mmol/L (ref 135–145)

## 2019-05-05 LAB — PREGNANCY, URINE: Preg Test, Ur: NEGATIVE

## 2019-05-05 MED ORDER — GRISEOFULVIN MICROSIZE 500 MG PO TABS: 500.0000 mg | ORAL_TABLET | Freq: Every day | ORAL | 0 refills | Status: AC

## 2019-05-05 NOTE — ED Provider Notes (Signed)
[facility] EMERGENCY DEPARTMENT Provider Note   CSN: [id number] Arrival date & time: 05/05/19  1420     History Chief Complaint  Patient presents with  . Headache  . Numbness    Tracey Alvarez is a 34 y.o. female with a history of PCOS, obesity, hypertension, presenting to the emergency department with constellation of symptoms.  Patient reports that she has had paresthesias in her left upper extremity left lower extremity about 3 to 4 weeks, which she describes as a "squeezing sensation especially in her lower leg.  She separately reports intermittent headaches sometimes associated with blurred vision only in her left eye.  These headaches occur at random.  They sometimes occur with waking up but not always.  They can last a few minutes to hours.  The blurred vision in her left eye also lasts a few minutes at a time and usually resolves.  She currently does not have a headache or any blurred vision.  She separately describes concerns for "painful bumps" on the top of her scalp, as well as the back of her neck.  She is also had these for several weeks.  She believes her hair has been falling out around these regions.  She reports she does have a family history of lupus but she herself has never been diagnosed with any autoimmune conditions.  She denies any known history of psoriasis in herself personally denies having plaques anywhere else on her body's.  She says she has had yeast infections in the past but has never had any fungal infection or ringworm that she is aware of.  She denies any fevers, chills, vomiting, photophobia.  She denies any neck stiffness.  She denies any history of stroke or TIA.  She denies headache currently.  HPI     Past Medical History:  Diagnosis Date  . Anemia 2012  . Depression 2012   POST we loss SURGERY; MEDS X 2 WEEKS, had ppd  . Infection 2005   UIT  . Infection    YEAST X 1  . PCOS (polycystic ovarian syndrome)   . Pneumonia     AS TEEN  . Pregnancy induced hypertension     Patient Active Problem List   Diagnosis Date Noted  . Elevated glucose tolerance test 11/17/2017  . Anemia affecting pregnancy 11/17/2017  . Chronic hypertension affecting pregnancy 11/16/2017  . Status post cesarean delivery 10/16/2012  . Gestational diabetes 10/09/2012  . Uterine size date discrepancy 08/11/2012  . Hx of laparoscopic partial gastrectomy 08/11/2012    Past Surgical History:  Procedure Laterality Date  . CESAREAN SECTION N/A 10/16/2012   Procedure: Primary Cesarean Section Delivery Baby Girl @ [id number], Apgars 8/9;  Surgeon: Maeola Sarah. Landry Mellow, MD;  Location: [facility];  Service: Obstetrics;  Laterality: N/A;  . CESAREAN SECTION N/A 05/02/2018   Procedure: CESAREAN SECTION;  Surgeon: Waymon Amato, MD;  Location: [facility];  Service: Obstetrics;  Laterality: N/A;  . TONSILLECTOMY  AGE 58  . VERTICAL SLEEVE GASGTRECTOMY  2012     OB History    Gravida  2   Para  2   Term  2   Preterm      AB      Living  2     SAB      TAB      Ectopic      Multiple  0   Live Births  2           Family History  Problem Relation Age of Onset  . Anemia Mother   . Seizures Brother        CHILDHOOD  . Diabetes Maternal Aunt   . Lupus Maternal Aunt   . Aneurysm Maternal Uncle   . Arthritis Maternal Grandmother   . Diabetes Maternal Grandmother   . Hyperlipidemia Maternal Grandmother   . Hypertension Maternal Grandmother   . Stroke Maternal Grandmother   . Heart disease Maternal Grandfather   . Hyperlipidemia Maternal Grandfather   . Stroke Maternal Grandfather   . Lupus Cousin     Social History   Tobacco Use  . Smoking status: Never Smoker  . Smokeless tobacco: Never Used  Substance Use Topics  . Alcohol use: Not Currently  . Drug use: No    Home Medications Prior to Admission medications   Medication Sig Start Date End Date Taking? Authorizing Provider  ferrous sulfate 325 (65 FE) MG tablet Take 1  tablet (325 mg total) by mouth daily with breakfast. 05/04/18 05/05/19 Yes Hartsog, Melvyn Novas, CNM  NIFEdipine (PROCARDIA XL/NIFEDICAL-XL) 90 MG 24 hr tablet Take 1 tablet (90 mg total) by mouth daily. 05/04/18  Yes Hartsog, Melvyn Novas, CNM  griseofulvin (GRIFULVIN V) 500 MG tablet Take 1 tablet (500 mg total) by mouth daily. 05/05/19 06/16/19  Wyvonnia Dusky, MD  ibuprofen (ADVIL,MOTRIN) 800 MG tablet Take 1 tablet (800 mg total) by mouth every 8 (eight) hours. 05/04/18   Hartsog, Carol A, CNM  Iron-FA-B Cmp-C-Biot-Probiotic (FUSION PLUS) CAPS Take 1 capsule by mouth daily.    [provider]    Allergies    Shellfish allergy  Review of Systems   Review of Systems  Constitutional: Negative for chills and fever.  Eyes: Positive for visual disturbance. Negative for pain and redness.  Respiratory: Negative for cough and shortness of breath.   Gastrointestinal: Negative for abdominal pain and vomiting.  Genitourinary: Negative for dysuria and hematuria.  Skin: Positive for rash. Negative for pallor.  Neurological: Positive for numbness and headaches. Negative for seizures, syncope, facial asymmetry, speech difficulty, weakness and light-headedness.  All other systems reviewed and are negative.   Physical Exam Updated Vital Signs BP 132/83 (BP Location: Left Arm)   Pulse 84   Temp 98.5 F (36.9 C) (Oral)   Resp 18   Ht 5\' 8"  (1.727 m)   Wt 133.4 kg   LMP 05/04/2019   SpO2 100%   BMI 44.70 kg/m   Physical Exam Vitals and nursing note reviewed.  Constitutional:      General: She is not in acute distress.    Appearance: She is well-developed. She is obese.  HENT:     Head: Normocephalic and atraumatic.     Comments: Hair loss with some scaling, tender small pustules or nodules under hairline on top of scalp Eyes:     General: No visual field deficit or scleral icterus.    Extraocular Movements: Extraocular movements intact.     Conjunctiva/sclera: Conjunctivae normal.      Pupils: Pupils are equal, round, and reactive to light.  Cardiovascular:     Rate and Rhythm: Normal rate and regular rhythm.     Heart sounds: Normal heart sounds.  Pulmonary:     Effort: Pulmonary effort is normal. No respiratory distress.     Breath sounds: Normal breath sounds.  Abdominal:     Palpations: Abdomen is soft.     Tenderness: There is no abdominal tenderness.  Musculoskeletal:     Cervical back: Normal range of  motion and neck supple.  Skin:    General: Skin is warm and dry.     Comments: Scaling circular plaque on posterior neck and lateral neck (photo below) No other similar plaques or lesions on body  Neurological:     Mental Status: She is alert.     GCS: GCS eye subscore is 4. GCS verbal subscore is 5. GCS motor subscore is 6.     Cranial Nerves: Cranial nerves are intact. No cranial nerve deficit, dysarthria or facial asymmetry.     Sensory: Sensation is intact.     Motor: Motor function is intact.     Coordination: Coordination is intact.     Gait: Gait is intact.  Psychiatric:        Mood and Affect: Mood normal.        Behavior: Behavior normal.        ED Results / Procedures / Treatments   Labs (all labs ordered are listed, but only abnormal results are displayed) Labs Reviewed  CBC WITH DIFFERENTIAL/PLATELET - Abnormal; Notable for the following components:      Result Value   WBC 13.4 (*)    RBC 5.22 (*)    MCH 24.9 (*)    Neutro Abs 9.2 (*)    All other components within normal limits  BASIC METABOLIC PANEL  PREGNANCY, URINE    EKG EKG Interpretation  Date/Time:  Tuesday May 05 2019 16:53:38 EST Ventricular Rate:  91 PR Interval:    QRS Duration: 103 QT Interval:  375 QTC Calculation: 462 R Axis:   52 Text Interpretation: Sinus rhythm Prolonged PR interval Probable left atrial enlargement Borderline T abnormalities, inferior leads Baseline wander in lead(s) II III aVF No STEMI Confirmed by Octaviano Glow [phone]) on 05/05/2019  4:56:00 PM   Radiology CT Head Wo Contrast  Result Date: 05/05/2019 CLINICAL DATA:  Acute headache.  Left arm and leg paresthesia. EXAM: CT HEAD WITHOUT CONTRAST TECHNIQUE: Contiguous axial images were obtained from the base of the skull through the vertex without intravenous contrast. COMPARISON:  None. FINDINGS: Brain: No evidence of acute infarction, hemorrhage, hydrocephalus, extra-axial collection or mass lesion/mass effect. Vascular: Negative for hyperdense vessel Skull: Negative Sinuses/Orbits: Negative Other: None IMPRESSION: Negative CT head Electronically Signed   By: Franchot Gallo M.D.   On: 05/05/2019 17:08    Procedures Procedures (including critical care time)  Medications Ordered in ED Medications - No data to display  ED Course  I have reviewed the triage vital signs and the nursing notes.  Pertinent labs & imaging results that were available during my care of the patient were reviewed by me and considered in my medical decision making (see chart for details).  34 yo female w/ PCOS, obesity, presenting to ED with complaint of painful rash on her scalp, hair loss for several weeks, as well as intermittent headaches and blurred left vision for the past several weeks.  Also reporting left sided body "numbness and tingling."  1. Headache - May be tension type vs. Migraine headache.  Also may be a component of pain related to her scalp lesions - No recent CT imaging in past 10 years, we'll get a scan to ensure no mass lesion causing her headaches and left eye blurred vision - No signs or symptoms of meningitis, less likely SAH or VTE given intermittent nature of her headache - With PCOS and obesity, pseudotumor remains on the differential.  However her headaches are not position, nor persistently worse in the morning,  and her blurred vision in a single eye is not suggestive of pseudotumor (I'd suspect bilateral deficits in that case).  She currently has no visual deficits or  complaints.  She may need neurology referral in the future or an outpatient LP to measure opening pressure if this remains on the differential, or if she develops bilateral symptoms or worsening vision changes.  I discussed this with her.  2.  Scalp lesions - Circular lesions on scalp and posterior neck suggestive to me of tinea capitis.  - Also possible is psoariasis or early lupus with family hx of lupus - We discussed a trial of antifungal medications for 6 weeks, if not improving her PCP may want to consider an autoimmune evaluatoin  3. Tingling/numbness in left arm and leg - CT pending, no clear emergent etiology otherwise - Electrolytes wnl. - Low suspicion for aortic dissection - She has a benign neurovascular exam.  Very low suspicion for stroke.   - Possible autoimmune presentation - again, may need future workup  She needs a PCP.  We'll refer her to our [facility] wellness clinic in the meantime, but I stressed to her the importance of following up on all of these issues.  She is no longer breast feeding.  Okay to proceed with antifungal agents now.     Final Clinical Impression(s) / ED Diagnoses Final diagnoses:  Chronic nonintractable headache, unspecified headache type  Paresthesia  Fungal scalp infection    Rx / DC Orders ED Discharge Orders         Ordered    griseofulvin (GRIFULVIN V) 500 MG tablet  Daily     05/05/19 1835           Wyvonnia Dusky, MD 05/05/19 2035

## 2019-05-05 NOTE — Discharge Instructions (Addendum)
Your CT scan today did not show signs of a stroke or brain tumor.  We decided to begin treatment today for possible fungal infection of your scalp noticed tinea capitis.  You will need to be on griseofulvin for 6 weeks.  I prescribed you this medication.  Do NOT take this medication while breastfeeding or if you are planning to get pregnant.  As we discussed, it is also possible that the lesions on your head are related to an autoimmune condition such as psoriasis.  This is because you have a family history of autoimmune condition.  It is difficult for me to determine this at this time.  If you have not improved after your course of griseofulvin for fungal infection, your provider may wish to pursue treatment for psoriasis.  Please call the community health center to establish care with a provider.

## 2019-05-05 NOTE — ED Triage Notes (Addendum)
Pt c/o pain and "indentations" to left parietal area x 3-4 days- left eye blurred vision and numbness left UE and LE x 3-4 weeks-also c/o hair loss-NAD-to triage in w/c

## 2019-05-05 NOTE — ED Notes (Signed)
ED Provider at bedside. Dr. Langston Masker

## 2019-12-14 ENCOUNTER — Other Ambulatory Visit: Payer: Self-pay

## 2019-12-14 ENCOUNTER — Emergency Department (HOSPITAL_BASED_OUTPATIENT_CLINIC_OR_DEPARTMENT_OTHER)
Admission: EM | Admit: 2019-12-14 | Discharge: 2019-12-14 | Disposition: A | Discharge status: Home / Self Care | Payer: BLUE CROSS/BLUE SHIELD | Attending: Emergency Medicine | Admitting: Emergency Medicine

## 2019-12-14 ENCOUNTER — Encounter (HOSPITAL_BASED_OUTPATIENT_CLINIC_OR_DEPARTMENT_OTHER): Payer: Self-pay | Admitting: Emergency Medicine

## 2019-12-14 ENCOUNTER — Emergency Department (HOSPITAL_BASED_OUTPATIENT_CLINIC_OR_DEPARTMENT_OTHER): Payer: BLUE CROSS/BLUE SHIELD

## 2019-12-14 DIAGNOSIS — I10 Essential (primary) hypertension: Secondary | ICD-10-CM | POA: Insufficient documentation

## 2019-12-14 DIAGNOSIS — I2510 Atherosclerotic heart disease of native coronary artery without angina pectoris: Secondary | ICD-10-CM | POA: Diagnosis not present

## 2019-12-14 DIAGNOSIS — Z79899 Other long term (current) drug therapy: Secondary | ICD-10-CM | POA: Diagnosis not present

## 2019-12-14 DIAGNOSIS — E119 Type 2 diabetes mellitus without complications: Secondary | ICD-10-CM | POA: Diagnosis not present

## 2019-12-14 DIAGNOSIS — R0789 Other chest pain: Secondary | ICD-10-CM | POA: Diagnosis not present

## 2019-12-14 DIAGNOSIS — R079 Chest pain, unspecified: Secondary | ICD-10-CM | POA: Diagnosis present

## 2019-12-14 LAB — BASIC METABOLIC PANEL
Anion gap: 11 (ref 5–15)
BUN: 26 mg/dL — ABNORMAL HIGH (ref 6–20)
CO2: 25 mmol/L (ref 22–32)
Calcium: 9.2 mg/dL (ref 8.9–10.3)
Chloride: 100 mmol/L (ref 98–111)
Creatinine, Ser: 1.02 mg/dL — ABNORMAL HIGH (ref 0.44–1.00)
GFR calc Af Amer: >60 mL/min (ref >60)
GFR calc non Af Amer: >60 mL/min (ref >60)
Glucose, Bld: 104 mg/dL — ABNORMAL HIGH (ref 70–99)
Potassium: 3.4 mmol/L — ABNORMAL LOW (ref 3.5–5.1)
Sodium: 136 mmol/L (ref 135–145)

## 2019-12-14 LAB — CBC WITH DIFFERENTIAL/PLATELET
Abs Immature Granulocytes: 0.04 10*3/uL (ref 0.00–0.07)
Basophils Absolute: 0 10*3/uL (ref 0.0–0.1)
Basophils Relative: 0 %
Eosinophils Absolute: 0.1 10*3/uL (ref 0.0–0.5)
Eosinophils Relative: 1 %
HCT: 37.2 % (ref 36.0–46.0)
Hemoglobin: 11.7 g/dL — ABNORMAL LOW (ref 12.0–15.0)
Immature Granulocytes: 0 %
Lymphocytes Relative: 26 %
Lymphs Abs: 3.4 10*3/uL (ref 0.7–4.0)
MCH: 24.4 pg — ABNORMAL LOW (ref 26.0–34.0)
MCHC: 31.5 g/dL (ref 30.0–36.0)
MCV: 77.7 fL — ABNORMAL LOW (ref 80.0–100.0)
Monocytes Absolute: 0.8 10*3/uL (ref 0.1–1.0)
Monocytes Relative: 6 %
Neutro Abs: 8.7 10*3/uL — ABNORMAL HIGH (ref 1.7–7.7)
Neutrophils Relative %: 67 %
Platelets: 199 10*3/uL (ref 150–400)
RBC: 4.79 MIL/uL (ref 3.87–5.11)
RDW: 15 % (ref 11.5–15.5)
WBC: 13.1 10*3/uL — ABNORMAL HIGH (ref 4.0–10.5)
nRBC: 0 % (ref 0.0–0.2)

## 2019-12-14 LAB — D-DIMER, QUANTITATIVE: D-Dimer, Quant: <0.27 ug/mL-FEU (ref 0.00–0.50)

## 2019-12-14 LAB — TROPONIN I (HIGH SENSITIVITY)
Troponin I (High Sensitivity): 7 ng/L (ref <18)
Troponin I (High Sensitivity): 7 ng/L (ref <18)

## 2019-12-14 NOTE — Discharge Instructions (Addendum)
You were seen today for chest pain.  Your work-up is reassuring including chest x-ray and heart testing.  Take ibuprofen as needed for discomfort.  Follow-up with your primary physician.

## 2019-12-14 NOTE — ED Triage Notes (Signed)
Central cp described as pressure for the last two hours.  Reports she was watching tv.  Also c/o sob.  Hx htn.

## 2019-12-14 NOTE — ED Provider Notes (Signed)
[facility] EMERGENCY DEPARTMENT Provider Note   CSN: [id number] Arrival date & time: 12/14/19  4332     History Chief Complaint  Patient presents with  . Chest Pain    Tracey Alvarez is a 34 y.o. female.  HPI  HPI: A 34 year old patient with a history of hypertension and obesity presents for evaluation of chest pain. Initial onset of pain was approximately 1-3 hours ago. The patient's chest pain is described as heaviness/pressure/tightness and is not worse with exertion. The patient's chest pain is middle- or left-sided, is not well-localized, is not sharp and does not radiate to the arms/jaw/neck. The patient does not complain of nausea and denies diaphoresis. The patient has no history of stroke, has no history of peripheral artery disease, has not smoked in the past 90 days, denies any history of treated diabetes, has no relevant family history of coronary artery disease (first degree relative at less than age 28) and has no history of hypercholesterolemia.    This is a 34 year old female who presents with chest discomfort.  Patient reports that she was watching TV approximately 2 hours prior to arrival when she had onset of anterior pressure-like chest pain she also reports some shortness of breath.  No recent coughs or fevers.  No upper respiratory symptoms.  Denies leg swelling or history of blood clots.  Rates her pain a 2 out of 10.  She did not take anything for her pain.  Denies early family history of heart disease.  Does have a personal history of blood pressure.  Past Medical History:  Diagnosis Date  . Anemia 2012  . Depression 2012   POST we loss SURGERY; MEDS X 2 WEEKS, had ppd  . Infection 2005   UIT  . Infection    YEAST X 1  . PCOS (polycystic ovarian syndrome)   . Pneumonia    AS TEEN  . Pregnancy induced hypertension     Patient Active Problem List   Diagnosis Date Noted  . Elevated glucose tolerance test 11/17/2017  . Anemia affecting  pregnancy 11/17/2017  . Chronic hypertension affecting pregnancy 11/16/2017  . Status post cesarean delivery 10/16/2012  . Gestational diabetes 10/09/2012  . Uterine size date discrepancy 08/11/2012  . Hx of laparoscopic partial gastrectomy 08/11/2012    Past Surgical History:  Procedure Laterality Date  . CESAREAN SECTION N/A 10/16/2012   Procedure: Primary Cesarean Section Delivery Baby Girl @ 9518, Apgars 8/9;  Surgeon: Maeola Sarah. Landry Mellow, MD;  Location: [facility];  Service: Obstetrics;  Laterality: N/A;  . CESAREAN SECTION N/A 05/02/2018   Procedure: CESAREAN SECTION;  Surgeon: Waymon Amato, MD;  Location: [facility];  Service: Obstetrics;  Laterality: N/A;  . TONSILLECTOMY  AGE 25  . VERTICAL SLEEVE GASGTRECTOMY  2012     OB History    Gravida  2   Para  2   Term  2   Preterm      AB      Living  2     SAB      TAB      Ectopic      Multiple  0   Live Births  2           Family History  Problem Relation Age of Onset  . Anemia Mother   . Seizures Brother        CHILDHOOD  . Diabetes Maternal Aunt   . Lupus Maternal Aunt   . Aneurysm Maternal Uncle   .  Arthritis Maternal Grandmother   . Diabetes Maternal Grandmother   . Hyperlipidemia Maternal Grandmother   . Hypertension Maternal Grandmother   . Stroke Maternal Grandmother   . Heart disease Maternal Grandfather   . Hyperlipidemia Maternal Grandfather   . Stroke Maternal Grandfather   . Lupus Cousin     Social History   Tobacco Use  . Smoking status: Never Smoker  . Smokeless tobacco: Never Used  Vaping Use  . Vaping Use: Never used  Substance Use Topics  . Alcohol use: Not Currently  . Drug use: No    Home Medications Prior to Admission medications   Medication Sig Start Date End Date Taking? Authorizing Provider  ferrous sulfate 325 (65 FE) MG tablet Take 1 tablet (325 mg total) by mouth daily with breakfast. 05/04/18 05/05/19  Hartsog, Melvyn Novas, CNM  ibuprofen (ADVIL,MOTRIN) 800 MG tablet  Take 1 tablet (800 mg total) by mouth every 8 (eight) hours. 05/04/18   Hartsog, Carol A, CNM  Iron-FA-B Cmp-C-Biot-Probiotic (FUSION PLUS) CAPS Take 1 capsule by mouth daily.    [provider]  NIFEdipine (PROCARDIA XL/NIFEDICAL-XL) 90 MG 24 hr tablet Take 1 tablet (90 mg total) by mouth daily. 05/04/18   Hartsog, Melvyn Novas, CNM    Allergies    Shellfish allergy  Review of Systems   Review of Systems  Constitutional: Negative for fever.  Respiratory: Positive for shortness of breath. Negative for cough.   Cardiovascular: Positive for chest pain. Negative for leg swelling.  Gastrointestinal: Negative for abdominal pain, nausea and vomiting.  Genitourinary: Negative for dysuria.  All other systems reviewed and are negative.   Physical Exam Updated Vital Signs BP (!) 152/92 (BP Location: Right Arm)   Pulse 67   Temp 98.4 F (36.9 C) (Oral)   Resp 20   Ht 1.727 m (5\' 8" )   Wt 134.3 kg   SpO2 100%   BMI 45.01 kg/m   Physical Exam Vitals and nursing note reviewed.  Constitutional:      Appearance: She is well-developed. She is obese. She is not ill-appearing.  HENT:     Head: Normocephalic and atraumatic.  Eyes:     Pupils: Pupils are equal, round, and reactive to light.  Cardiovascular:     Rate and Rhythm: Normal rate and regular rhythm.     Heart sounds: Normal heart sounds.  Pulmonary:     Effort: Pulmonary effort is normal. No respiratory distress.     Breath sounds: No wheezing.  Chest:     Chest wall: Tenderness present.  Abdominal:     General: Bowel sounds are normal.     Palpations: Abdomen is soft.  Musculoskeletal:     Cervical back: Neck supple.     Right lower leg: No tenderness. No edema.     Left lower leg: No tenderness. No edema.  Skin:    General: Skin is warm and dry.  Neurological:     Mental Status: She is alert and oriented to person, place, and time.  Psychiatric:        Mood and Affect: Mood normal.     ED Results / Procedures /  Treatments   Labs (all labs ordered are listed, but only abnormal results are displayed) Labs Reviewed  CBC WITH DIFFERENTIAL/PLATELET - Abnormal; Notable for the following components:      Result Value   WBC 13.1 (*)    Hemoglobin 11.7 (*)    MCV 77.7 (*)    MCH 24.4 (*)  Neutro Abs 8.7 (*)    All other components within normal limits  BASIC METABOLIC PANEL - Abnormal; Notable for the following components:   Potassium 3.4 (*)    Glucose, Bld 104 (*)    BUN 26 (*)    Creatinine, Ser 1.02 (*)    All other components within normal limits  D-DIMER, QUANTITATIVE (NOT AT [facility])  TROPONIN I (HIGH SENSITIVITY)  TROPONIN I (HIGH SENSITIVITY)    EKG EKG Interpretation  Date/Time:  Monday December 14 2019 02:35:19 EDT Ventricular Rate:  80 PR Interval:    QRS Duration: 101 QT Interval:  380 QTC Calculation: 439 R Axis:   46 Text Interpretation: Sinus rhythm Prolonged PR interval Probable left atrial enlargement Confirmed by Thayer Jew [phone]) on 12/14/2019 2:39:07 AM   Radiology DG Chest 2 View  Result Date: 12/14/2019 CLINICAL DATA:  Central chest pain for 2 hours. Shortness of breath. History of hypertension. EXAM: CHEST - 2 VIEW COMPARISON:  None. FINDINGS: The heart size and mediastinal contours are within normal limits. Both lungs are clear. The visualized skeletal structures are unremarkable. IMPRESSION: No active cardiopulmonary disease. Electronically Signed   By: Lucienne Capers M.D.   On: 12/14/2019 03:16    Procedures Procedures (including critical care time)  Medications Ordered in ED Medications - No data to display  ED Course  I have reviewed the triage vital signs and the nursing notes.  Pertinent labs & imaging results that were available during my care of the patient were reviewed by me and considered in my medical decision making (see chart for details).    MDM Rules/Calculators/A&P HEAR Score: 2                         Presents with atypical  chest pain.  She is overall nontoxic and vital signs are reassuring except for blood pressure of 157/100.  Reports she has been taking her blood pressure medication.  Chest pain nonexertional.  Considerations include but not limited to, ACS, musculoskeletal, PE.    EKG shows no acute ischemic changes.  D-dimer is negative.  Troponin x2 is negative.  Doubt PE or ACS.  Given reproducible nature of pain on physical exam, musculoskeletal etiology is a consideration.  Chest x-ray without any obvious pneumothorax or pneumonia.  Discussed the results with the patient.  Recommend keeping a log of her blood pressures and following up with her primary physician for medication adjustment.  After history, exam, and medical workup I feel the patient has been appropriately medically screened and is safe for discharge home. Pertinent diagnoses were discussed with the patient. Patient was given return precautions.   Final Clinical Impression(s) / ED Diagnoses Final diagnoses:  Atypical chest pain    Rx / DC Orders ED Discharge Orders    None       Asaad Gulley, Barbette Hair, MD 12/14/19 [phone]

## 2020-01-07 ENCOUNTER — Encounter (HOSPITAL_BASED_OUTPATIENT_CLINIC_OR_DEPARTMENT_OTHER): Payer: Self-pay | Admitting: *Deleted

## 2020-01-07 ENCOUNTER — Emergency Department (HOSPITAL_BASED_OUTPATIENT_CLINIC_OR_DEPARTMENT_OTHER)
Admission: EM | Admit: 2020-01-07 | Discharge: 2020-01-07 | Disposition: A | Discharge status: Home / Self Care | Payer: BLUE CROSS/BLUE SHIELD | Attending: Emergency Medicine | Admitting: Emergency Medicine

## 2020-01-07 ENCOUNTER — Other Ambulatory Visit: Payer: Self-pay

## 2020-01-07 DIAGNOSIS — T7840XA Allergy, unspecified, initial encounter: Secondary | ICD-10-CM | POA: Insufficient documentation

## 2020-01-07 DIAGNOSIS — R221 Localized swelling, mass and lump, neck: Secondary | ICD-10-CM | POA: Diagnosis present

## 2020-01-07 MED ORDER — METHYLPREDNISOLONE SODIUM SUCC 125 MG IJ SOLR
125.0000 mg | Freq: Once | INTRAMUSCULAR | Status: AC
  Administered 2020-01-07: 125 mg via INTRAVENOUS
  Filled 2020-01-07: qty 2

## 2020-01-07 MED ORDER — DIPHENHYDRAMINE HCL 50 MG/ML IJ SOLN
50.0000 mg | Freq: Once | INTRAMUSCULAR | Status: AC
  Administered 2020-01-07: 50 mg via INTRAVENOUS
  Filled 2020-01-07: qty 1

## 2020-01-07 MED ORDER — FAMOTIDINE IN NACL 20-0.9 MG/50ML-% IV SOLN
20.0000 mg | Freq: Once | INTRAVENOUS | Status: AC
  Administered 2020-01-07: 20 mg via INTRAVENOUS
  Filled 2020-01-07: qty 50

## 2020-01-07 NOTE — ED Notes (Signed)
Pt stated that she felt better and sleepy.  Significant other at bedside.

## 2020-01-07 NOTE — Discharge Instructions (Addendum)
Discontinue using the fiber supplement as it caused an allergic reaction today I would recommend taking Benadryl for the next 4-5 days to help with your allergic reaction Follow up with your PCP regarding your ED visit today Return to the ED for any worsening symptoms including return of throat swelling, shortness of breath, wheezing, inability to swallow, drooling, abdominal pain, nausea, vomiting, hives, or any other new/concerning symptoms

## 2020-01-07 NOTE — ED Notes (Signed)
Ate some Glucomanan which is part of a wt loss regiment and she started  She took more today than yesterday and about an hour later she fel a fullness inher throat and dysphagia  Came to ER feels better  Now after meds we gave her

## 2020-01-07 NOTE — ED Triage Notes (Signed)
C/o throat " fullness" x 3 hrs after taking fiber supplement.

## 2020-01-07 NOTE — ED Provider Notes (Signed)
[facility] EMERGENCY DEPARTMENT Provider Note   CSN: [id number] Arrival date & time: 01/07/20  1720     History Chief Complaint  Patient presents with  . Sore Throat    Tracey Alvarez is a 34 y.o. female who presents to the ED today with complaint of throat swelling approximately 15 minutes after drinking glucomannan supplement with water. Pt reports this was the first time taking this fiber supplement. She mitxed the powder with water and drank it however noticed a "fullness" in her throat 15 minutes later. Pt reports she tried to drink water but felt like it was going down much slower than normal. She also tried to eat a piece of waffle but states it felt like it wanted to come back up and she had to force it down. She did take a claritin prior to arrival without relief. Pt denies any shortness of breath, wheezing, rash, abdominal pain, nausea, vomiting, or any other associated symptoms.   The history is provided by the patient and medical records.       Past Medical History:  Diagnosis Date  . Anemia 2012  . Depression 2012   POST we loss SURGERY; MEDS X 2 WEEKS, had ppd  . Infection 2005   UIT  . Infection    YEAST X 1  . PCOS (polycystic ovarian syndrome)   . Pneumonia    AS TEEN  . Pregnancy induced hypertension     Patient Active Problem List   Diagnosis Date Noted  . Elevated glucose tolerance test 11/17/2017  . Anemia affecting pregnancy 11/17/2017  . Chronic hypertension affecting pregnancy 11/16/2017  . Status post cesarean delivery 10/16/2012  . Gestational diabetes 10/09/2012  . Uterine size date discrepancy 08/11/2012  . Hx of laparoscopic partial gastrectomy 08/11/2012    Past Surgical History:  Procedure Laterality Date  . CESAREAN SECTION N/A 10/16/2012   Procedure: Primary Cesarean Section Delivery Baby Girl @ 4098, Apgars 8/9;  Surgeon: Maeola Sarah. Landry Mellow, MD;  Location: [facility];  Service: Obstetrics;  Laterality: N/A;  . CESAREAN SECTION  N/A 05/02/2018   Procedure: CESAREAN SECTION;  Surgeon: Waymon Amato, MD;  Location: [facility];  Service: Obstetrics;  Laterality: N/A;  . TONSILLECTOMY  AGE 12  . VERTICAL SLEEVE GASGTRECTOMY  2012     OB History    Gravida  2   Para  2   Term  2   Preterm      AB      Living  2     SAB      TAB      Ectopic      Multiple  0   Live Births  2           Family History  Problem Relation Age of Onset  . Anemia Mother   . Seizures Brother        CHILDHOOD  . Diabetes Maternal Aunt   . Lupus Maternal Aunt   . Aneurysm Maternal Uncle   . Arthritis Maternal Grandmother   . Diabetes Maternal Grandmother   . Hyperlipidemia Maternal Grandmother   . Hypertension Maternal Grandmother   . Stroke Maternal Grandmother   . Heart disease Maternal Grandfather   . Hyperlipidemia Maternal Grandfather   . Stroke Maternal Grandfather   . Lupus Cousin     Social History   Tobacco Use  . Smoking status: Never Smoker  . Smokeless tobacco: Never Used  Vaping Use  . Vaping Use: Never used  Substance Use Topics  . Alcohol use: Not Currently  . Drug use: No    Home Medications Prior to Admission medications   Medication Sig Start Date End Date Taking? Authorizing Provider  losartan-hydrochlorothiazide (HYZAAR) 50-12.5 MG tablet Take 1 tablet by mouth daily.   Yes [provider]  ferrous sulfate 325 (65 FE) MG tablet Take 1 tablet (325 mg total) by mouth daily with breakfast. 05/04/18 05/05/19  Hartsog, Melvyn Novas, CNM  ibuprofen (ADVIL,MOTRIN) 800 MG tablet Take 1 tablet (800 mg total) by mouth every 8 (eight) hours. 05/04/18   Hartsog, Melvyn Novas, CNM    Allergies    Shellfish allergy  Review of Systems   Review of Systems  Constitutional: Negative for chills and fever.  HENT: Positive for trouble swallowing.        + throat swelling  Respiratory: Negative for shortness of breath and wheezing.     Physical Exam Updated Vital Signs BP (!) 188/114 (BP  Location: Right Arm)   Pulse 73   Temp 98.3 F (36.8 C) (Oral)   Resp 16   Ht 5\' 8"  (1.727 m)   Wt 133.4 kg   LMP 12/18/2019   SpO2 100%   BMI 44.70 kg/m   Physical Exam Vitals and nursing note reviewed.  Constitutional:      Appearance: She is not ill-appearing or diaphoretic.     Comments: Speaking in full clear sentences and tolerating secretions  HENT:     Head: Normocephalic and atraumatic.     Mouth/Throat:     Mouth: Mucous membranes are moist.     Pharynx: Uvula midline. No pharyngeal swelling.  Eyes:     Conjunctiva/sclera: Conjunctivae normal.  Cardiovascular:     Rate and Rhythm: Normal rate and regular rhythm.     Heart sounds: Normal heart sounds.  Pulmonary:     Effort: Pulmonary effort is normal.     Breath sounds: Normal breath sounds. No wheezing, rhonchi or rales.  Skin:    General: Skin is warm and dry.     Coloration: Skin is not jaundiced.     Findings: No rash.  Neurological:     Mental Status: She is alert.     ED Results / Procedures / Treatments   Labs (all labs ordered are listed, but only abnormal results are displayed) Labs Reviewed - No data to display  EKG None  Radiology No results found.  Procedures Procedures (including critical care time)  Medications Ordered in ED Medications  famotidine (PEPCID) IVPB 20 mg premix (0 mg Intravenous Stopped 01/07/20 1829)  diphenhydrAMINE (BENADRYL) injection 50 mg (50 mg Intravenous Given 01/07/20 1802)  methylPREDNISolone sodium succinate (SOLU-MEDROL) 125 mg/2 mL injection 125 mg (125 mg Intravenous Given 01/07/20 1802)    ED Course  I have reviewed the triage vital signs and the nursing notes.  Pertinent labs & imaging results that were available during my care of the patient were reviewed by me and considered in my medical decision making (see chart for details).  Clinical Course as of Jan 06 1957  Thu Jan 07, 2020  1849 BP(!): 159/103 [MV]    Clinical Course User Index [MV]  Eustaquio Maize, [place]   MDM Rules/Calculators/A&P                          34 year old female presents to the ED today with complaint of throat swelling after taking a new fiber supplement earlier today.  On arrival to  the ED patient is afebrile, nontachycardic nontachypneic.  She appears to be no acute distress.  She had denies any shortness of breath, wheezing, abdominal pain, nausea, vomiting.  On exam her uvula is midline without any posterior oropharyngeal erythema or edema.  She is able to speak in full sentences and tolerating her secretions without difficulty.  I suspect allergic reaction at this time will treat with Benadryl, Solu-Medrol, Pepcid.  We will continue to monitor in the ED. Patient's blood pressure significantly elevated at 184/113, will recheck.   Recheck BP 160/98. Pt states she did not take her  Medications today.   Pt reevaluated after meds; reports feeling improved. She was provided with water and crackers and able to tolerate it without difficulty. She is instructed to discontinue use of the fiber supplement. She is recommended to take benadryl for the next few days to help with the allergic reaction her body is having. She is to follow up with PCP for recheck. Pt is in agreement with plan and stable for discharge home.   This note was prepared using Dragon voice recognition software and may include unintentional dictation errors due to the inherent limitations of voice recognition software.  Final Clinical Impression(s) / ED Diagnoses Final diagnoses:  Allergic reaction, initial encounter    Rx / DC Orders ED Discharge Orders    None       Discharge Instructions     Discontinue using the fiber supplement as it caused an allergic reaction today I would recommend taking Benadryl for the next 4-5 days to help with your allergic reaction Follow up with your PCP regarding your ED visit today Return to the ED for any worsening symptoms including return of throat  swelling, shortness of breath, wheezing, inability to swallow, drooling, abdominal pain, nausea, vomiting, hives, or any other new/concerning symptoms       Eustaquio Maize, PA-C 01/07/20 1958    Blanchie Dessert, MD 01/07/20 2237

## 2020-03-08 IMAGING — CT CT HEAD W/O CM
3 series · 15 of 47 positions shown, 18 images · non-contrast
Comparison: None.

CLINICAL DATA: Acute headache.  Left arm and leg paresthesia.

EXAM:
CT HEAD WITHOUT CONTRAST
TECHNIQUE: Contiguous axial images were obtained from the base of the skull
through the vertex without intravenous contrast.

[Series 2: head wo · axial · 0.46mm/px · z∈[-50,+84]mm · 9 of 33 slices shown, 12 images]
[im 3/33  brain]
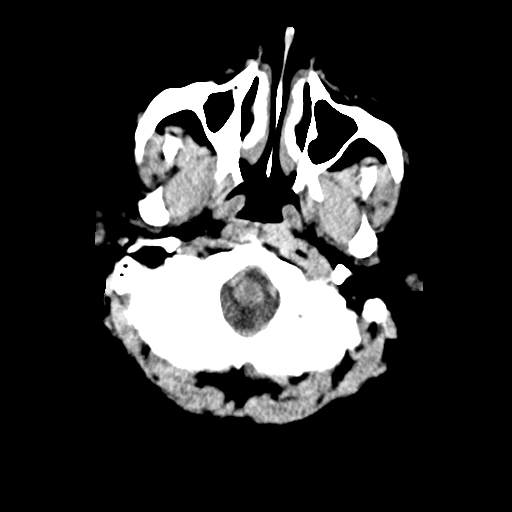
[im 3/33  bone]
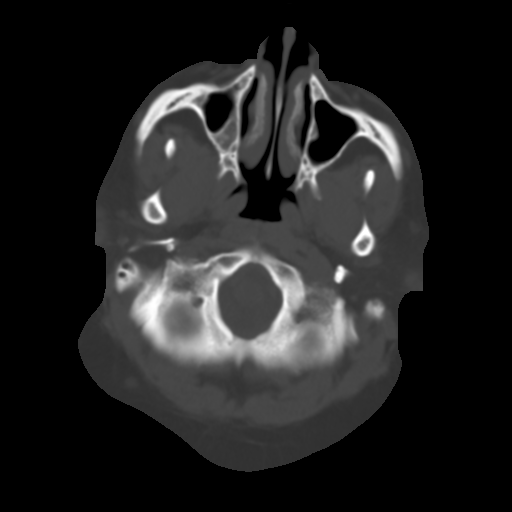
[im 6/33  brain]
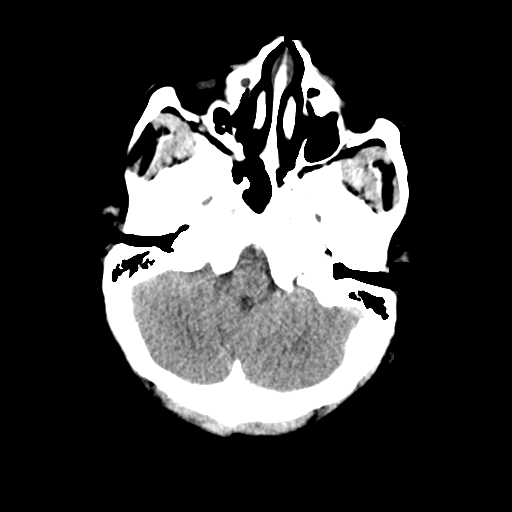
[im 9/33  brain]
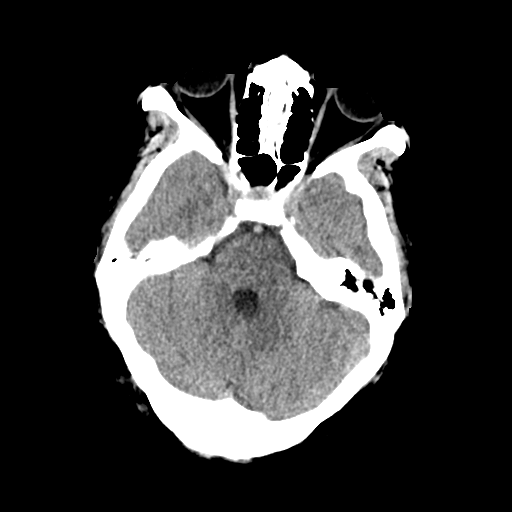
[im 13/33  brain]
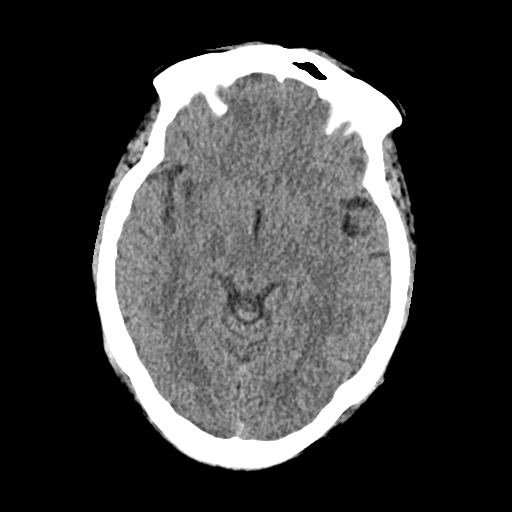
[im 17/33  brain]
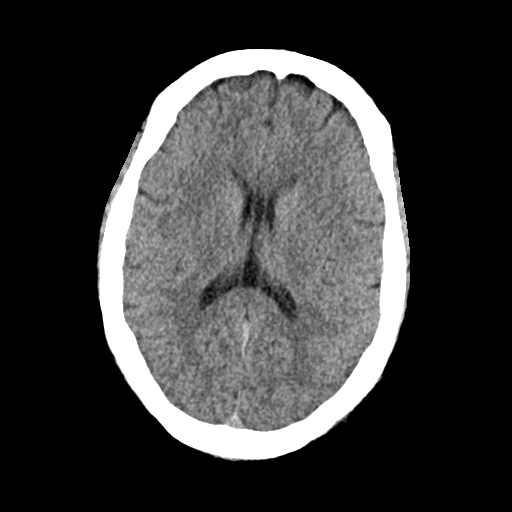
[im 17/33  bone]
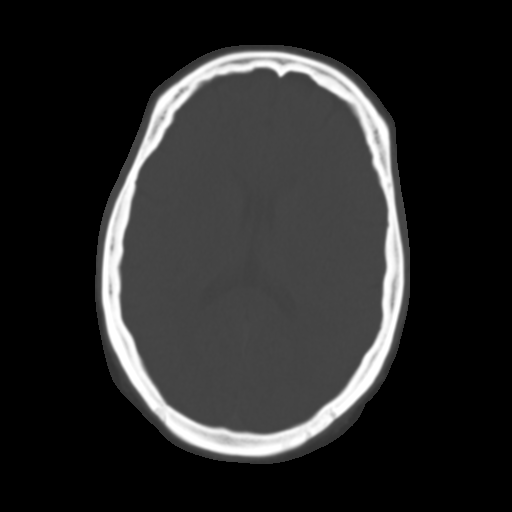
[im 20/33  brain]
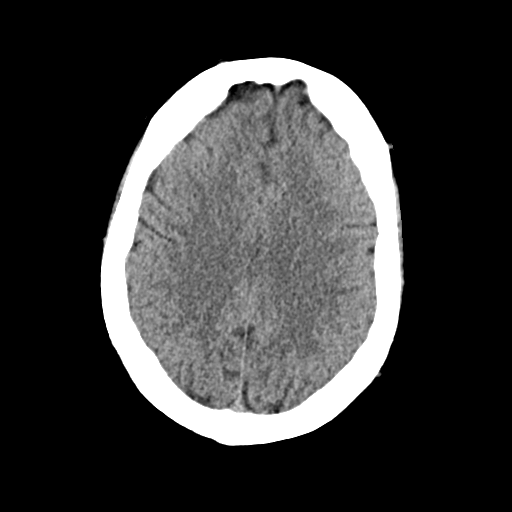
[im 24/33  brain]
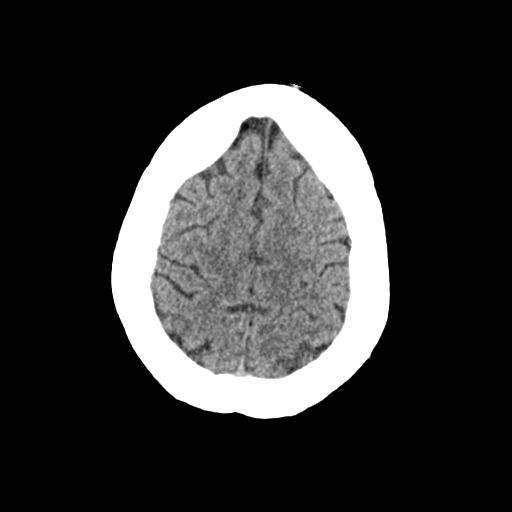
[im 27/33  brain]
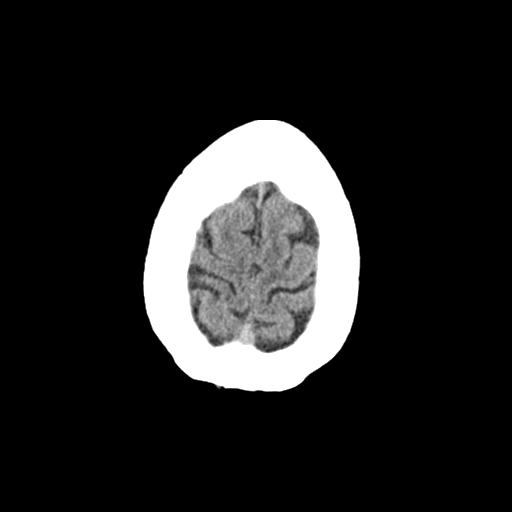
[im 30/33  brain]
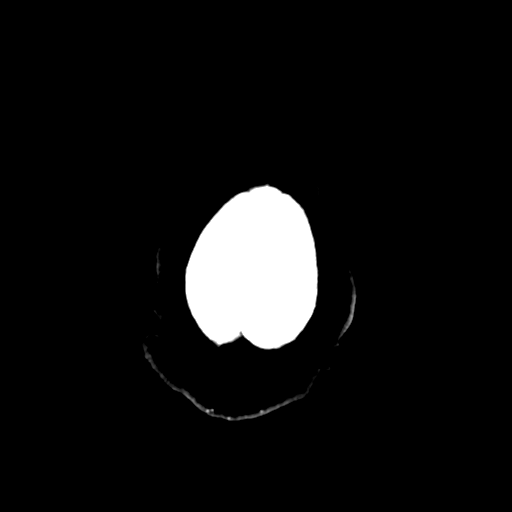
[im 30/33  bone]
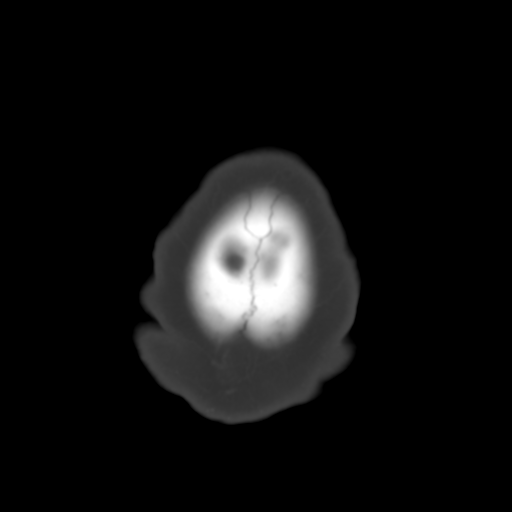

[Series 4: coronal soft · coronal · 0.35mm/px · 3 of 77 slices shown]
[im 26/77  brain]
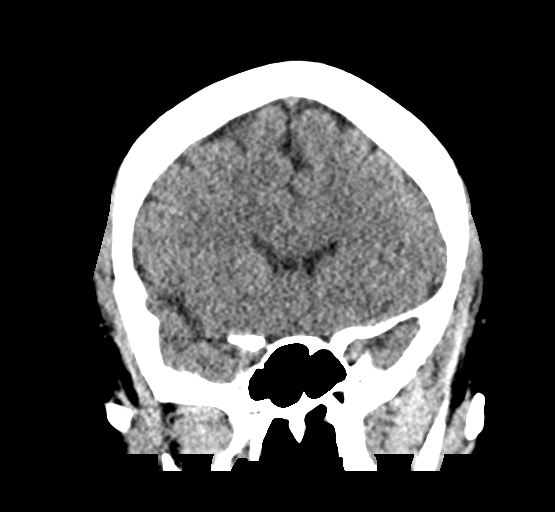
[im 34/77  brain]
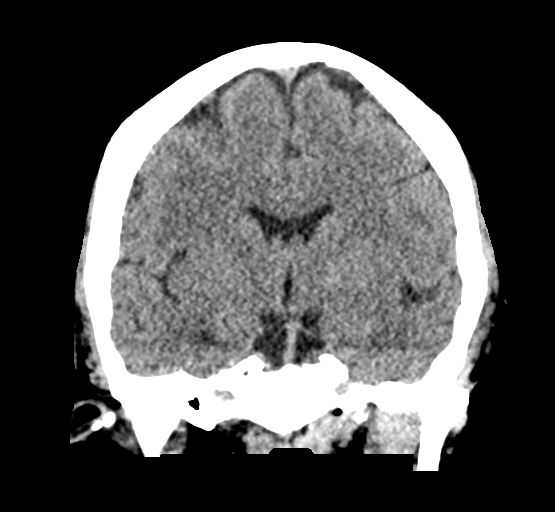
[im 43/77  brain]
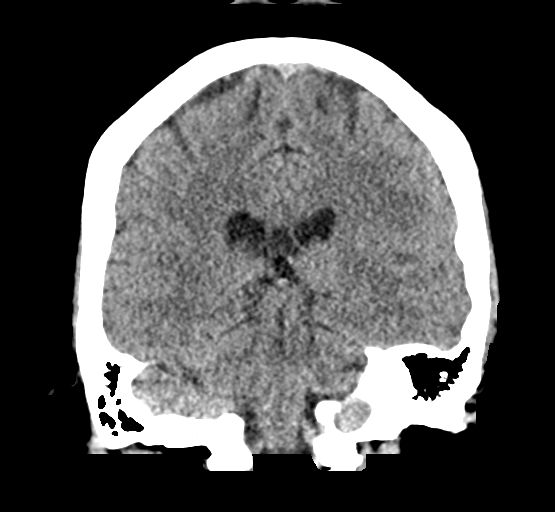

[Series 5: sag soft · sagittal · 0.32mm/px · 3 of 67 slices shown]
[im 23/67  brain]
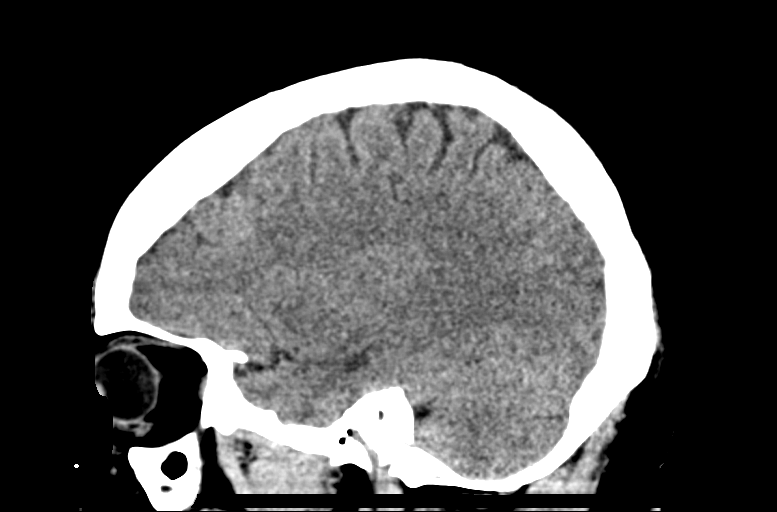
[im 34/67  brain]
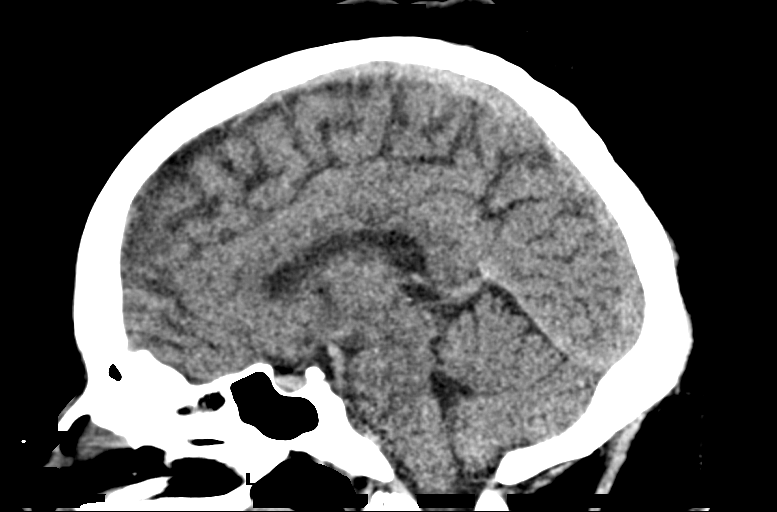
[im 45/67  brain]
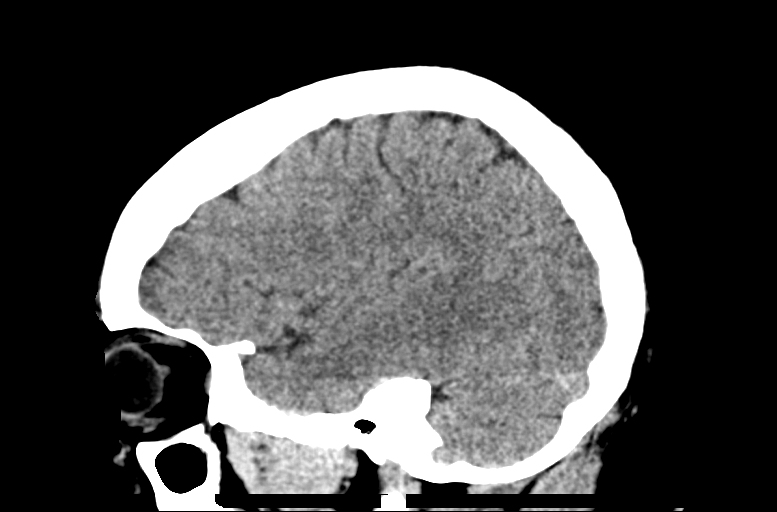

[15 of 47 positions shown; findings below may reference images not displayed]

FINDINGS: Brain: No evidence of acute infarction, hemorrhage, hydrocephalus,
extra-axial collection or mass lesion/mass effect.

Vascular: Negative for hyperdense vessel

Skull: Negative

Sinuses/Orbits: Negative

Other: None
IMPRESSION: Negative CT head

## 2021-02-15 LAB — CYTOLOGY - PAP
HPV DNA High Risk: NEGATIVE
Pap: NEGATIVE

## 2022-07-16 ENCOUNTER — Encounter (HOSPITAL_BASED_OUTPATIENT_CLINIC_OR_DEPARTMENT_OTHER): Payer: Self-pay | Admitting: Emergency Medicine

## 2022-07-16 ENCOUNTER — Emergency Department (HOSPITAL_BASED_OUTPATIENT_CLINIC_OR_DEPARTMENT_OTHER): Payer: PRIVATE HEALTH INSURANCE

## 2022-07-16 ENCOUNTER — Observation Stay (HOSPITAL_BASED_OUTPATIENT_CLINIC_OR_DEPARTMENT_OTHER)
Admission: EM | Admit: 2022-07-16 | Discharge: 2022-07-17 | Disposition: A | Discharge status: Home / Self Care | Payer: PRIVATE HEALTH INSURANCE | Attending: Family Medicine | Admitting: Family Medicine

## 2022-07-16 ENCOUNTER — Other Ambulatory Visit: Payer: Self-pay

## 2022-07-16 DIAGNOSIS — Z79899 Other long term (current) drug therapy: Secondary | ICD-10-CM | POA: Diagnosis not present

## 2022-07-16 DIAGNOSIS — D509 Iron deficiency anemia, unspecified: Secondary | ICD-10-CM | POA: Diagnosis not present

## 2022-07-16 DIAGNOSIS — Z6841 Body Mass Index (BMI) 40.0 and over, adult: Secondary | ICD-10-CM | POA: Insufficient documentation

## 2022-07-16 DIAGNOSIS — F419 Anxiety disorder, unspecified: Secondary | ICD-10-CM | POA: Diagnosis not present

## 2022-07-16 DIAGNOSIS — E282 Polycystic ovarian syndrome: Secondary | ICD-10-CM | POA: Diagnosis present

## 2022-07-16 DIAGNOSIS — I160 Hypertensive urgency: Secondary | ICD-10-CM | POA: Diagnosis not present

## 2022-07-16 DIAGNOSIS — E876 Hypokalemia: Secondary | ICD-10-CM | POA: Diagnosis not present

## 2022-07-16 DIAGNOSIS — R519 Headache, unspecified: Secondary | ICD-10-CM | POA: Diagnosis present

## 2022-07-16 DIAGNOSIS — I10 Essential (primary) hypertension: Secondary | ICD-10-CM | POA: Insufficient documentation

## 2022-07-16 LAB — CBC WITH DIFFERENTIAL/PLATELET
Abs Immature Granulocytes: 0.09 10*3/uL — ABNORMAL HIGH (ref 0.00–0.07)
Basophils Absolute: 0 10*3/uL (ref 0.0–0.1)
Basophils Relative: 0 %
Eosinophils Absolute: 0.2 10*3/uL (ref 0.0–0.5)
Eosinophils Relative: 2 %
HCT: 35.8 % — ABNORMAL LOW (ref 36.0–46.0)
Hemoglobin: 10.9 g/dL — ABNORMAL LOW (ref 12.0–15.0)
Immature Granulocytes: 1 %
Lymphocytes Relative: 27 %
Lymphs Abs: 3.1 10*3/uL (ref 0.7–4.0)
MCH: 23.2 pg — ABNORMAL LOW (ref 26.0–34.0)
MCHC: 30.4 g/dL (ref 30.0–36.0)
MCV: 76.2 fL — ABNORMAL LOW (ref 80.0–100.0)
Monocytes Absolute: 0.7 10*3/uL (ref 0.1–1.0)
Monocytes Relative: 6 %
Neutro Abs: 7.4 10*3/uL (ref 1.7–7.7)
Neutrophils Relative %: 64 %
Platelets: 208 10*3/uL (ref 150–400)
RBC: 4.7 MIL/uL (ref 3.87–5.11)
RDW: 16.5 % — ABNORMAL HIGH (ref 11.5–15.5)
WBC: 11.5 10*3/uL — ABNORMAL HIGH (ref 4.0–10.5)
nRBC: 0 % (ref 0.0–0.2)

## 2022-07-16 LAB — URINALYSIS, ROUTINE W REFLEX MICROSCOPIC
Bilirubin Urine: NEGATIVE
Glucose, UA: NEGATIVE mg/dL
Ketones, ur: NEGATIVE mg/dL
Leukocytes,Ua: NEGATIVE
Nitrite: NEGATIVE
Protein, ur: 100 mg/dL — AB
Specific Gravity, Urine: 1.01 (ref 1.005–1.030)
pH: 7 (ref 5.0–8.0)

## 2022-07-16 LAB — HIV ANTIBODY (ROUTINE TESTING W REFLEX): HIV Screen 4th Generation wRfx: NONREACTIVE

## 2022-07-16 LAB — BASIC METABOLIC PANEL
Anion gap: 10 (ref 5–15)
BUN: 19 mg/dL (ref 6–20)
CO2: 28 mmol/L (ref 22–32)
Calcium: 9.3 mg/dL (ref 8.9–10.3)
Chloride: 101 mmol/L (ref 98–111)
Creatinine, Ser: 1.03 mg/dL — ABNORMAL HIGH (ref 0.44–1.00)
GFR, Estimated: >60 mL/min (ref >60)
Glucose, Bld: 105 mg/dL — ABNORMAL HIGH (ref 70–99)
Potassium: 3.2 mmol/L — ABNORMAL LOW (ref 3.5–5.1)
Sodium: 139 mmol/L (ref 135–145)

## 2022-07-16 LAB — URINALYSIS, MICROSCOPIC (REFLEX)

## 2022-07-16 LAB — MRSA NEXT GEN BY PCR, NASAL: MRSA by PCR Next Gen: NOT DETECTED

## 2022-07-16 LAB — TROPONIN I (HIGH SENSITIVITY)
Troponin I (High Sensitivity): 8 ng/L (ref <18)
Troponin I (High Sensitivity): 8 ng/L (ref <18)

## 2022-07-16 LAB — HCG, SERUM, QUALITATIVE: Preg, Serum: NEGATIVE

## 2022-07-16 MED ORDER — ACETAMINOPHEN 650 MG RE SUPP: 650.0000 mg | Freq: Four times a day (QID) | RECTAL | Status: DC | PRN

## 2022-07-16 MED ORDER — HYDRALAZINE HCL 25 MG PO TABS
25.0000 mg | ORAL_TABLET | ORAL | Status: AC
  Administered 2022-07-16: 25 mg via ORAL
  Filled 2022-07-16: qty 1

## 2022-07-16 MED ORDER — LABETALOL HCL 5 MG/ML IV SOLN
20.0000 mg | Freq: Once | INTRAVENOUS | Status: AC
  Administered 2022-07-16: 20 mg via INTRAVENOUS
  Filled 2022-07-16: qty 4

## 2022-07-16 MED ORDER — ALBUTEROL SULFATE (2.5 MG/3ML) 0.083% IN NEBU: 2.5000 mg | INHALATION_SOLUTION | RESPIRATORY_TRACT | Status: DC | PRN

## 2022-07-16 MED ORDER — LOSARTAN POTASSIUM 50 MG PO TABS
50.0000 mg | ORAL_TABLET | Freq: Every day | ORAL | Status: DC
  Administered 2022-07-17: 50 mg via ORAL
  Filled 2022-07-16: qty 1

## 2022-07-16 MED ORDER — BUTALBITAL-APAP-CAFFEINE 50-325-40 MG PO TABS
1.0000 | ORAL_TABLET | ORAL | Status: DC | PRN
  Administered 2022-07-16 (×2): 1 via ORAL
  Filled 2022-07-16 (×2): qty 1

## 2022-07-16 MED ORDER — CHLORHEXIDINE GLUCONATE CLOTH 2 % EX PADS
6.0000 | MEDICATED_PAD | Freq: Every day | CUTANEOUS | Status: DC
  Administered 2022-07-16: 6 via TOPICAL

## 2022-07-16 MED ORDER — ACETAMINOPHEN 325 MG PO TABS
650.0000 mg | ORAL_TABLET | Freq: Four times a day (QID) | ORAL | Status: DC | PRN
  Administered 2022-07-16 (×2): 650 mg via ORAL
  Filled 2022-07-16 (×2): qty 2

## 2022-07-16 MED ORDER — POTASSIUM CHLORIDE CRYS ER 20 MEQ PO TBCR
40.0000 meq | EXTENDED_RELEASE_TABLET | Freq: Once | ORAL | Status: AC
  Administered 2022-07-16: 40 meq via ORAL
  Filled 2022-07-16: qty 2

## 2022-07-16 MED ORDER — LORAZEPAM 0.5 MG PO TABS
0.5000 mg | ORAL_TABLET | ORAL | Status: DC | PRN
  Administered 2022-07-16: 0.5 mg via ORAL
  Filled 2022-07-16: qty 1

## 2022-07-16 MED ORDER — NICARDIPINE HCL IN NACL 20-0.86 MG/200ML-% IV SOLN
3.0000 mg/h | INTRAVENOUS | Status: DC
  Administered 2022-07-16: 7.5 mg/h via INTRAVENOUS
  Administered 2022-07-16: 10 mg/h via INTRAVENOUS
  Administered 2022-07-16: 7.5 mg/h via INTRAVENOUS
  Administered 2022-07-16: 5 mg/h via INTRAVENOUS
  Administered 2022-07-17: 7.5 mg/h via INTRAVENOUS
  Filled 2022-07-16 (×8): qty 200

## 2022-07-16 MED ORDER — HYDROCHLOROTHIAZIDE 12.5 MG PO TABS
12.5000 mg | ORAL_TABLET | Freq: Every day | ORAL | Status: DC
  Administered 2022-07-17: 12.5 mg via ORAL
  Filled 2022-07-16: qty 1

## 2022-07-16 MED ORDER — ONDANSETRON HCL 4 MG/2ML IJ SOLN: 4.0000 mg | Freq: Four times a day (QID) | INTRAMUSCULAR | Status: DC | PRN

## 2022-07-16 MED ORDER — LABETALOL HCL 5 MG/ML IV SOLN
10.0000 mg | INTRAVENOUS | Status: AC
  Administered 2022-07-16: 10 mg via INTRAVENOUS
  Filled 2022-07-16: qty 4

## 2022-07-16 MED ORDER — TRAZODONE HCL 50 MG PO TABS: 25.0000 mg | ORAL_TABLET | Freq: Every evening | ORAL | Status: DC | PRN

## 2022-07-16 MED ORDER — ONDANSETRON HCL 4 MG PO TABS: 4.0000 mg | ORAL_TABLET | Freq: Four times a day (QID) | ORAL | Status: DC | PRN

## 2022-07-16 MED ORDER — LABETALOL HCL 5 MG/ML IV SOLN: 20.0000 mg | Freq: Once | INTRAVENOUS | Status: DC

## 2022-07-16 MED ORDER — AMLODIPINE BESYLATE 10 MG PO TABS: 10.0000 mg | ORAL_TABLET | Freq: Every day | ORAL | Status: DC

## 2022-07-16 MED ORDER — ENOXAPARIN SODIUM 60 MG/0.6ML IJ SOSY
60.0000 mg | PREFILLED_SYRINGE | INTRAMUSCULAR | Status: DC
  Administered 2022-07-16: 60 mg via SUBCUTANEOUS
  Filled 2022-07-16: qty 0.6

## 2022-07-16 MED ORDER — SPIRONOLACTONE 12.5 MG HALF TABLET
12.5000 mg | ORAL_TABLET | ORAL | Status: AC
  Administered 2022-07-16: 12.5 mg via ORAL
  Filled 2022-07-16: qty 1

## 2022-07-16 MED ORDER — ACETAMINOPHEN 325 MG PO TABS: 650.0000 mg | ORAL_TABLET | Freq: Four times a day (QID) | ORAL | Status: DC | PRN

## 2022-07-16 MED ORDER — LOSARTAN POTASSIUM 25 MG PO TABS
50.0000 mg | ORAL_TABLET | ORAL | Status: AC
  Administered 2022-07-16: 50 mg via ORAL
  Filled 2022-07-16: qty 2

## 2022-07-16 MED ORDER — HYDROCHLOROTHIAZIDE 25 MG PO TABS
12.5000 mg | ORAL_TABLET | Freq: Once | ORAL | Status: AC
  Administered 2022-07-16: 12.5 mg via ORAL
  Filled 2022-07-16: qty 1

## 2022-07-16 MED ORDER — CHLORHEXIDINE GLUCONATE CLOTH 2 % EX PADS: 6.0000 | MEDICATED_PAD | Freq: Every day | CUTANEOUS | Status: DC

## 2022-07-16 MED ORDER — LOSARTAN POTASSIUM-HCTZ 50-12.5 MG PO TABS: 1.0000 | ORAL_TABLET | Freq: Every day | ORAL | Status: DC

## 2022-07-16 MED ORDER — AMLODIPINE BESYLATE 5 MG PO TABS
5.0000 mg | ORAL_TABLET | Freq: Two times a day (BID) | ORAL | Status: DC
  Administered 2022-07-16: 5 mg via ORAL
  Filled 2022-07-16: qty 1

## 2022-07-16 NOTE — ED Notes (Signed)
Report given to [facility] 

## 2022-07-16 NOTE — ED Notes (Signed)
Patient ambulatory to bathroom with steady gait noted.

## 2022-07-16 NOTE — ED Provider Notes (Signed)
  Physical Exam  BP (!) 175/86   Pulse 77   Temp 98.2 F (36.8 C)   Resp 19   Ht 5\' 8"  (1.727 m)   Wt (!) 137 kg   SpO2 97%   BMI 45.92 kg/m   Physical Exam  Procedures  Procedures  ED Course / MDM   Clinical Course as of 07/16/22 0915  Mon Jul 16, 2022  1610 Assumed care from Dr Read Drivers. In brief, 37 yo F who presented with HTN and chest pain with transient L sided paresthesias and mild headache that got better with asa prior to arrival. Chest pain has resolved. BP better after labetalol x2. May require admission for further workup.  [RP]  0707 Reassessed patient. She remains asymptomatic but BP is >200 mmHg systolic.  Patient amenable to admission.  Hospitalist paged and patient's home medications ordered for her blood pressure. [RP]  0729 Dr Kirby Crigler will accept to hospital for admission. [RP]    Clinical Course User Index [RP] Rondel Baton, MD   Medical Decision Making Amount and/or Complexity of Data Reviewed Labs: ordered. Radiology: ordered. ECG/medicine tests: ordered.  Risk Prescription drug management. Decision regarding hospitalization.     Rondel Baton, MD 07/16/22 [phone]

## 2022-07-16 NOTE — H&P (Signed)
History and Physical  Tracey Alvarez GEX:[id number] DOB: 1985/12/18 DOA: 07/16/2022  PCP: Doristine Bosworth, MD (Inactive)   Chief Complaint: Headache, elevated blood pressure  HPI: Tracey Alvarez is a 37 y.o. female with medical history significant for morbid obesity, hypertension being admitted to stepdown unit due to hypertensive urgency.  The patient tells me that she was diagnosed with hypertension about 3 years ago at that time was told that she has whitecoat hypertension.  She was told to monitor her blood pressure at home.  She has been compliant with her home blood pressure medications which include Aldactone and losartan-HCTZ.  These medication doses have been stable.  Over the last week, she started feeling a little lightheaded, some chest discomfort, and these were reminiscent of in the past when her blood pressure was severely uncontrolled.  Due to this, she checked her blood pressure over the last few days, it was running intermittently as high as 200s/100s.  She never had any chest pain.  In the last couple days, there were periods of time with the blood pressure was as low as 140/100.  However early this morning, her blood pressure once again was severely elevated, she was having a headache, so she decided to present to the emergency department.  On review of systems, the patient also tells me that while she does not have any significant prior history of this, she has been feeling anxious lately.  She is unable to identify what in particular she is anxious about.  She and her family had cold and flu a couple weeks ago, she took some Coricidin and ibuprofen at that time, but nothing in the last couple of weeks.  ED Course: On presentation to med [facility], patient's initial blood pressure was 246/134.  Over the course of her hospital stay, she was given a dose of IV labetalol, potassium repletion, and her home medications were resumed.  Hospitalist was contacted for  admission.  Review of Systems: Please see HPI for pertinent positives and negatives. A complete 10 system review of systems are otherwise negative.  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 Richardson Dopp, MD;  Location: [facility];  Service: Obstetrics;  Laterality: N/A;   CESAREAN SECTION N/A 05/02/2018   Procedure: CESAREAN SECTION;  Surgeon: Hoover Browns, MD;  Location: [facility]IRTHING SUIT[facility];  Service: Obstetrics;  Laterality: N/A;   TONSILLECTOMY  AGE 51   VERTICAL SLEEVE GASGTRECTOMY  2012    Social History:  reports that she has never smoked. She has never used smokeless tobacco. She reports that she does not currently use alcohol. She reports that she does not use drugs.   Allergies  Allergen Reactions   Shellfish Allergy Shortness Of Breath    Family History  Problem Relation Age of Onset   Anemia Mother    Seizures Brother        CHILDHOOD   Diabetes Maternal Aunt    Lupus Maternal Aunt    Aneurysm Maternal Uncle  Arthritis Maternal Grandmother    Diabetes Maternal Grandmother    Hyperlipidemia Maternal Grandmother    Hypertension Maternal Grandmother    Stroke Maternal Grandmother    Heart disease Maternal Grandfather    Hyperlipidemia Maternal Grandfather    Stroke Maternal Grandfather    Lupus Cousin      Prior to Admission medications   Medication Sig Start Date End Date Taking? Authorizing Provider  ferrous sulfate 325 (65 FE) MG tablet Take 1 tablet (325 mg total) by mouth daily with breakfast. 05/04/18 05/05/19  Hartsog, Marry Guan, CNM  ibuprofen (ADVIL,MOTRIN) 800 MG  tablet Take 1 tablet (800 mg total) by mouth every 8 (eight) hours. 05/04/18   Hartsog, Marry Guan, CNM  losartan-hydrochlorothiazide (HYZAAR) 50-12.5 MG tablet Take 1 tablet by mouth daily.    [provider]  metFORMIN (GLUCOPHAGE) 500 MG tablet Take 500 mg by mouth 2 (two) times daily.    [provider]  spironolactone (ALDACTONE) 25 MG tablet Take 12.5 mg by mouth daily.    [provider]    Physical Exam: BP (!) 217/119 (BP Location: Right Arm)   Pulse 83   Temp 98.2 F (36.8 C)   Resp 18   Ht 5\' 8"  (1.727 m)   Wt (!) 137 kg   SpO2 99%   BMI 45.92 kg/m   General:  Alert, oriented, calm, obese female resting in bed on the stepdown unit, in no acute distress  Eyes: EOMI, clear conjuctivae, white sclerea Neck: supple, no masses, trachea mildline  Cardiovascular: RRR, no murmurs or rubs, no peripheral edema  Respiratory: clear to auscultation bilaterally, no wheezes, no crackles  Abdomen: soft, nontender, nondistended, normal bowel tones heard  Skin: dry, no rashes  Musculoskeletal: no joint effusions, normal range of motion  Psychiatric: appropriate affect, normal speech  Neurologic: extraocular muscles intact, clear speech, moving all extremities with intact sensorium          Labs on Admission:  Basic Metabolic Panel: Recent Labs  Lab 07/16/22 0550  NA 139  K 3.2*  CL 101  CO2 28  GLUCOSE 105*  BUN 19  CREATININE 1.03*  CALCIUM 9.3   Liver Function Tests: No results for input(s): "AST", "ALT", "ALKPHOS", "BILITOT", "PROT", "ALBUMIN" in the last 168 hours. No results for input(s): "LIPASE", "AMYLASE" in the last 168 hours. No results for input(s): "AMMONIA" in the last 168 hours. CBC: Recent Labs  Lab 07/16/22 0550  WBC 11.5*  NEUTROABS 7.4  HGB 10.9*  HCT 35.8*  MCV 76.2*  PLT 208   Cardiac Enzymes: No results for input(s): "CKTOTAL", "CKMB", "CKMBINDEX", "TROPONINI" in the last 168 hours.  BNP (last 3 results) No results for  input(s): "BNP" in the last 8760 hours.  ProBNP (last 3 results) No results for input(s): "PROBNP" in the last 8760 hours.  CBG: No results for input(s): "GLUCAP" in the last 168 hours.  Radiological Exams on Admission: CT Head Wo Contrast  Result Date: 07/16/2022 CLINICAL DATA:  Neuro deficit with acute stroke suspected. Hypertension with lightheadedness EXAM: CT HEAD WITHOUT CONTRAST TECHNIQUE: Contiguous axial images were obtained from the base of the skull through the vertex without intravenous contrast. RADIATION DOSE REDUCTION: This exam was performed according to the departmental dose-optimization program which includes automated exposure control, adjustment of the mA and/or kV according to patient size and/or use of iterative reconstruction technique. COMPARISON:  05/05/2019 FINDINGS: Brain: No evidence of acute infarction, hemorrhage, hydrocephalus, extra-axial collection or mass lesion/mass effect. Vascular: No hyperdense vessel  or unexpected calcification. Skull: Normal. Negative for fracture or focal lesion. Sinuses/Orbits: No acute finding. IMPRESSION: No acute finding Electronically Signed   By: Tiburcio Pea M.D.   On: 07/16/2022 06:16   DG Chest 2 View  Result Date: 07/16/2022 CLINICAL DATA:  Chest pain, numbness and lightheadedness. Hypertension. EXAM: CHEST - 2 VIEW COMPARISON:  12/07/2020 FINDINGS: Mild cardiac enlargement. There is no pleural fluid, interstitial edema or airspace disease. No signs of pneumothorax. Visualized osseous structures are unremarkable. IMPRESSION: Mild cardiac enlargement.  No acute abnormality. Electronically Signed   By: Signa Kell M.D.   On: 07/16/2022 06:13    Assessment/Plan Principal Problem:   Hypertensive urgency-unclear etiology of her out-of-control blood pressure.  Currently denies chest pain. -Observation admission to stepdown unit -Give IV labetalol 10 mg x 1 now -Initiate nicardipine drip, titrate for goal systolic blood pressure  140 -Home medications with the addition of amlodipine will be resumed; she received her home medications this morning at [facility] -Would consider evaluating for thyroid disease, adrenal disease, renal artery stenosis, etc. if blood pressure remains difficult to control  Anxiety-this is likely also playing a role in her elevated blood pressure currently, will make Ativan 0.5 mg p.o. available as needed for anxiety  Active Problems:   Obesity, Class III, BMI 40-49.9 (morbid obesity) (HCC)-complicating all aspects of care, and is certainly not helping her blood pressure control   PCOS (polycystic ovarian syndrome)-hold metformin while in-house  DVT prophylaxis: Lovenox     Code Status: Full Code  Consults called: None  Admission status: Observation  Time spent: 53 minutes  Donavyn Fecher Sharlette Dense MD [facility] Pager [phone]  If 7PM-7AM, please contact night-coverage [email or web address] Password [facility]  07/16/2022, 1:54 PM

## 2022-07-16 NOTE — ED Triage Notes (Signed)
Pt reports noticing HTN over the last few days (160s/100s). Tonight she stated she noticed feeling lightheaded, chest pain and some numbness in her L arm and leg that is now resolved. Pt does have hx of HTN and is currently on meds for this. She reports not missing any doses the last few days. She also reports HA "for a few hours". She took one ASA at home, unsure of dose.

## 2022-07-16 NOTE — ED Provider Notes (Signed)
[facility] Provider Note: Lowella Dell, MD, FACEP  CSN: [id number] MRN: [id number] ARRIVAL: 07/16/22 at 0509 ROOM: [facility]06/[facility]06   CHIEF COMPLAINT  Hypertension   HISTORY OF PRESENT ILLNESS  07/16/22 5:29 AM Tracey Alvarez is a 37 y.o. female who has noticed her blood pressure being increased over the past several days (160 systolic, 100s diastolic).  This morning she started feeling lightheaded, with some pain in her chest (currently a 6 out of 10 but improving) and numbness in her left arm and leg which have now resolved.  She does have a history of hypertension and is currently on losartan/hydrochlorothiazide.  She has not missed any doses recently.  Her blood pressure is usually well-controlled.  She is also having a headache which is now resolving after taking aspirin prior to arrival.  She has had no shortness of breath, diaphoresis, nausea or vomiting with this.   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 Richardson Dopp, MD;  Location: [facility];  Service: Obstetrics;  Laterality: N/A;   CESAREAN SECTION N/A 05/02/2018   Procedure: CESAREAN SECTION;  Surgeon: Hoover Browns, MD;  Location: [facility] BIRTHING SUIT[facility];  Service: Obstetrics;  Laterality: N/A;   TONSILLECTOMY  AGE 68   VERTICAL SLEEVE GASGTRECTOMY  2012    Family History  Problem Relation Age of Onset   Anemia Mother    Seizures Brother        CHILDHOOD   Diabetes Maternal Aunt    Lupus Maternal Aunt    Aneurysm Maternal Uncle    Arthritis Maternal Grandmother    Diabetes Maternal Grandmother    Hyperlipidemia  Maternal Grandmother    Hypertension Maternal Grandmother    Stroke Maternal Grandmother    Heart disease Maternal Grandfather    Hyperlipidemia Maternal Grandfather    Stroke Maternal Grandfather    Lupus Cousin     Social History   Tobacco Use   Smoking status: Never   Smokeless tobacco: Never  Vaping Use   Vaping Use: Never used  Substance Use Topics   Alcohol use: Not Currently   Drug use: No    Prior to Admission medications   Medication Sig Start Date End Date Taking? Authorizing Provider  ferrous sulfate 325 (65 FE) MG tablet Take 1 tablet (325 mg total) by mouth daily with breakfast. 05/04/18 05/05/19  Hartsog, Marry Guan, CNM  ibuprofen (ADVIL,MOTRIN) 800 MG tablet Take 1 tablet (800 mg total) by mouth every 8 (eight) hours. 05/04/18   Hartsog, Marry Guan, CNM  losartan-hydrochlorothiazide (HYZAAR) 50-12.5 MG tablet Take 1 tablet by mouth daily.    [provider]    Allergies Shellfish allergy   REVIEW OF SYSTEMS  Negative except as noted here or in the History of Present Illness.   PHYSICAL EXAMINATION  Initial Vital Signs Blood pressure (!) 241/110, pulse 87, temperature 98.2 F (36.8 C), resp. rate 20, SpO2 100 %, unknown if currently breastfeeding.  Examination General: Well-developed, well-nourished female in no acute distress; appearance  consistent with age of record HENT: normocephalic; atraumatic Eyes: pupils equal, round and reactive to light; extraocular muscles intact Neck: supple Heart: regular rate and rhythm Lungs: clear to auscultation bilaterally Abdomen: soft; nondistended; nontender; bowel sounds present Extremities: No deformity; full range of motion; pulses normal Neurologic: Awake, alert and oriented; motor function intact in all extremities and symmetric; no facial droop Skin: Warm and dry Psychiatric: Normal mood and affect   RESULTS  Summary of this visit's results, reviewed and interpreted by myself:   EKG  Interpretation  Date/Time:  Monday July 16 2022 05:38:35 EDT Ventricular Rate:  89 PR Interval:  219 QRS Duration: 96 QT Interval:  383 QTC Calculation: 466 R Axis:   1 Text Interpretation: Sinus rhythm Prolonged PR interval Probable left atrial enlargement No significant change was found Confirmed by Paula Libra ([id number]) on 07/16/2022 5:44:57 AM       Laboratory Studies: Results for orders placed or performed during the hospital encounter of 07/16/22 (from the past 24 hour(s))  CBC with Differential     Status: Abnormal   Collection Time: 07/16/22  5:50 AM  Result Value Ref Range   WBC 11.5 (H) 4.0 - 10.5 K/uL   RBC 4.70 3.87 - 5.11 MIL/uL   Hemoglobin 10.9 (L) 12.0 - 15.0 g/dL   HCT 60.4 (L) 54.0 - 98.1 %   MCV 76.2 (L) 80.0 - 100.0 fL   MCH 23.2 (L) 26.0 - 34.0 pg   MCHC 30.4 30.0 - 36.0 g/dL   RDW 19.1 (H) 47.8 - 29.5 %   Platelets 208 150 - 400 K/uL   nRBC 0.0 0.0 - 0.2 %   Neutrophils Relative % 64 %   Neutro Abs 7.4 1.7 - 7.7 K/uL   Lymphocytes Relative 27 %   Lymphs Abs 3.1 0.7 - 4.0 K/uL   Monocytes Relative 6 %   Monocytes Absolute 0.7 0.1 - 1.0 K/uL   Eosinophils Relative 2 %   Eosinophils Absolute 0.2 0.0 - 0.5 K/uL   Basophils Relative 0 %   Basophils Absolute 0.0 0.0 - 0.1 K/uL   Immature Granulocytes 1 %   Abs Immature Granulocytes 0.09 (H) 0.00 - 0.07 K/uL  Basic metabolic panel     Status: Abnormal   Collection Time: 07/16/22  5:50 AM  Result Value Ref Range   Sodium 139 135 - 145 mmol/L   Potassium 3.2 (L) 3.5 - 5.1 mmol/L   Chloride 101 98 - 111 mmol/L   CO2 28 22 - 32 mmol/L   Glucose, Bld 105 (H) 70 - 99 mg/dL   BUN 19 6 - 20 mg/dL   Creatinine, Ser 6.21 (H) 0.44 - 1.00 mg/dL   Calcium 9.3 8.9 - 30.8 mg/dL   GFR, Estimated >65 >78 mL/min   Anion gap 10 5 - 15  Troponin I (High Sensitivity)     Status: None   Collection Time: 07/16/22  5:50 AM  Result Value Ref Range   Troponin I (High Sensitivity) 8 <18 ng/L  hCG, serum, qualitative      Status: None   Collection Time: 07/16/22  5:50 AM  Result Value Ref Range   Preg, Serum NEGATIVE NEGATIVE  Urinalysis, Routine w reflex microscopic -Urine, Clean Catch     Status: Abnormal   Collection Time: 07/16/22  5:59 AM  Result Value Ref Range   Color, Urine STRAW (A) YELLOW   APPearance CLEAR CLEAR   Specific Gravity, Urine 1.010 1.005 - 1.030   pH 7.0 5.0 -  8.0   Glucose, UA NEGATIVE NEGATIVE mg/dL   Hgb urine dipstick TRACE (A) NEGATIVE   Bilirubin Urine NEGATIVE NEGATIVE   Ketones, ur NEGATIVE NEGATIVE mg/dL   Protein, ur 161 (A) NEGATIVE mg/dL   Nitrite NEGATIVE NEGATIVE   Leukocytes,Ua NEGATIVE NEGATIVE  Urinalysis, Microscopic (reflex)     Status: Abnormal   Collection Time: 07/16/22  5:59 AM  Result Value Ref Range   RBC / HPF 0-5 0 - 5 RBC/hpf   WBC, UA 0-5 0 - 5 WBC/hpf   Bacteria, UA RARE (A) NONE SEEN   Squamous Epithelial / HPF 0-5 0 - 5 /HPF   Imaging Studies: CT Head Wo Contrast  Result Date: 07/16/2022 CLINICAL DATA:  Neuro deficit with acute stroke suspected. Hypertension with lightheadedness EXAM: CT HEAD WITHOUT CONTRAST TECHNIQUE: Contiguous axial images were obtained from the base of the skull through the vertex without intravenous contrast. RADIATION DOSE REDUCTION: This exam was performed according to the departmental dose-optimization program which includes automated exposure control, adjustment of the mA and/or kV according to patient size and/or use of iterative reconstruction technique. COMPARISON:  05/05/2019 FINDINGS: Brain: No evidence of acute infarction, hemorrhage, hydrocephalus, extra-axial collection or mass lesion/mass effect. Vascular: No hyperdense vessel or unexpected calcification. Skull: Normal. Negative for fracture or focal lesion. Sinuses/Orbits: No acute finding. IMPRESSION: No acute finding Electronically Signed   By: Tiburcio Pea M.D.   On: 07/16/2022 06:16   DG Chest 2 View  Result Date: 07/16/2022 CLINICAL DATA:  Chest pain,  numbness and lightheadedness. Hypertension. EXAM: CHEST - 2 VIEW COMPARISON:  12/07/2020 FINDINGS: Mild cardiac enlargement. There is no pleural fluid, interstitial edema or airspace disease. No signs of pneumothorax. Visualized osseous structures are unremarkable. IMPRESSION: Mild cardiac enlargement.  No acute abnormality. Electronically Signed   By: Signa Kell M.D.   On: 07/16/2022 06:13    ED COURSE and MDM  Nursing notes, initial and subsequent vitals signs, including pulse oximetry, reviewed and interpreted by myself.  Vitals:   07/16/22 0615 07/16/22 0630 07/16/22 0640 07/16/22 0645  BP: (!) 203/107 (!) 202/97 (!) 194/103 (!) 179/89  Pulse: 90 88 86 85  Resp: (!) 27 19 20 16   Temp:      SpO2: 98% 97% 98% 96%  Weight:      Height:       Medications  labetalol (NORMODYNE) injection 20 mg (20 mg Intravenous Given 07/16/22 0551)  labetalol (NORMODYNE) injection 20 mg (20 mg Intravenous Given 07/16/22 0628)  potassium chloride SA (KLOR-CON M) CR tablet 40 mEq (40 mEq Oral Given 07/16/22 0627)   5:57 AM Labetalol IV given for symptomatic hypertension (240/110).   6:18 AM Blood pressure down to 203/107.  Will repeat labetalol dose.  6:39 AM Blood pressure now 179/89 and patient is chest pain-free.  6:45 AM Signed out to Dr. Eloise Harman. Second troponin pending.    PROCEDURES  Procedures CRITICAL CARE Performed by: Carlisle Beers Keaghan Bowens Total critical care time: 30 minutes Critical care time was exclusive of separately billable procedures and treating other patients. Critical care was necessary to treat or prevent imminent or life-threatening deterioration. Critical care was time spent personally by me on the following activities: development of treatment plan with patient and/or surrogate as well as nursing, discussions with consultants, evaluation of patient's response to treatment, examination of patient, obtaining history from patient or surrogate, ordering and performing treatments  and interventions, ordering and review of laboratory studies, ordering and review of radiographic studies, pulse oximetry and re-evaluation of  patient's condition.   ED DIAGNOSES     ICD-10-CM   1. Hypertensive urgency  I16.0     2. Hypokalemia  E87.6     3. Microcytic anemia  D50.9          Kenyatte Gruber, MD 07/16/22 2237

## 2022-07-17 DIAGNOSIS — E876 Hypokalemia: Secondary | ICD-10-CM

## 2022-07-17 DIAGNOSIS — I160 Hypertensive urgency: Secondary | ICD-10-CM | POA: Diagnosis not present

## 2022-07-17 LAB — BASIC METABOLIC PANEL
Anion gap: 8 (ref 5–15)
BUN: 18 mg/dL (ref 6–20)
CO2: 25 mmol/L (ref 22–32)
Calcium: 8.5 mg/dL — ABNORMAL LOW (ref 8.9–10.3)
Chloride: 104 mmol/L (ref 98–111)
Creatinine, Ser: 0.92 mg/dL (ref 0.44–1.00)
GFR, Estimated: >60 mL/min (ref >60)
Glucose, Bld: 121 mg/dL — ABNORMAL HIGH (ref 70–99)
Potassium: 3.4 mmol/L — ABNORMAL LOW (ref 3.5–5.1)
Sodium: 137 mmol/L (ref 135–145)

## 2022-07-17 LAB — CBC
HCT: 34.5 % — ABNORMAL LOW (ref 36.0–46.0)
Hemoglobin: 10.6 g/dL — ABNORMAL LOW (ref 12.0–15.0)
MCH: 24 pg — ABNORMAL LOW (ref 26.0–34.0)
MCHC: 30.7 g/dL (ref 30.0–36.0)
MCV: 78.1 fL — ABNORMAL LOW (ref 80.0–100.0)
Platelets: 197 10*3/uL (ref 150–400)
RBC: 4.42 MIL/uL (ref 3.87–5.11)
RDW: 17.1 % — ABNORMAL HIGH (ref 11.5–15.5)
WBC: 9.3 10*3/uL (ref 4.0–10.5)
nRBC: 0 % (ref 0.0–0.2)

## 2022-07-17 MED ORDER — LOSARTAN POTASSIUM-HCTZ 50-12.5 MG PO TABS: 1.0000 | ORAL_TABLET | Freq: Every day | ORAL | 3 refills | Status: DC

## 2022-07-17 MED ORDER — POTASSIUM CHLORIDE CRYS ER 20 MEQ PO TBCR: 40.0000 meq | EXTENDED_RELEASE_TABLET | Freq: Once | ORAL | Status: DC

## 2022-07-17 MED ORDER — POTASSIUM CHLORIDE CRYS ER 20 MEQ PO TBCR
20.0000 meq | EXTENDED_RELEASE_TABLET | Freq: Once | ORAL | Status: AC
  Administered 2022-07-17: 20 meq via ORAL
  Filled 2022-07-17: qty 1

## 2022-07-17 MED ORDER — SPIRONOLACTONE 12.5 MG HALF TABLET
12.5000 mg | ORAL_TABLET | Freq: Every day | ORAL | Status: DC
  Administered 2022-07-17: 12.5 mg via ORAL
  Filled 2022-07-17: qty 1

## 2022-07-17 MED ORDER — AMLODIPINE BESYLATE 10 MG PO TABS: 10.0000 mg | ORAL_TABLET | Freq: Every day | ORAL | 3 refills | Status: DC

## 2022-07-17 MED ORDER — SPIRONOLACTONE 25 MG PO TABS: 12.5000 mg | ORAL_TABLET | Freq: Every day | ORAL | 3 refills | Status: DC

## 2022-07-17 MED ORDER — AMLODIPINE BESYLATE 10 MG PO TABS
10.0000 mg | ORAL_TABLET | Freq: Every day | ORAL | Status: DC
  Administered 2022-07-17: 10 mg via ORAL
  Filled 2022-07-17: qty 1

## 2022-07-17 NOTE — Discharge Summary (Signed)
Physician Discharge Summary   Patient: Tracey Alvarez MRN: [id number] DOB: 02/08/1986  Admit date:     07/16/2022  Discharge date: 07/17/22  Discharge Physician: Meredeth Ide   PCP: Doristine Bosworth, MD (Inactive)   Recommendations at discharge:   Follow-up PCP in 1 week  Discharge Diagnoses: Principal Problem:   Hypertensive urgency Active Problems:   Obesity, Class III, BMI 40-49.9 (morbid obesity) (HCC)   PCOS (polycystic ovarian syndrome)  Resolved Problems:   * No resolved hospital problems. *  Hospital Course:  37 year old female with a history of morbid obesity, hypertension presented with hypertensive urgency.  Patient takes Aldactone, losartan/HCTZ is at home.  For past few days she has been running very high blood pressure 200s/100s.  Came to ED with headache and high blood pressure.  Placed in observation for hypertensive urgency.    Assessment and Plan:  Hypertensive urgency -Resolved -Unclear etiology, patient says that she was compliant with medications -Patient given labetalol 10 mg IV x 1 -Started on a Cardene drip; Cardene drip has been weaned off. -Blood pressure is stable -Currently on amlodipine, losartan, HCTZ -Will discharge home on amlodipine 10 mg daily, losartan/HCTZ 50/12.5 mg daily, spironolactone 12.5 mg daily. -Blood pressure is well-controlled on this regimen. Today's Vitals   07/17/22 1000 07/17/22 1030 07/17/22 1100 07/17/22 1130  BP: (!) 144/79 (!) 153/79 (!) 147/82 136/73  Pulse: 82 85 77 84  Resp: 17 17 16 19   Temp:      TempSrc:      SpO2: 99% 100% 99% 98%  Weight:      Height:      PainSc:       Body mass index is 46.9 kg/m.     Hypokalemia -Replace potassium before discharge  PCOS -Takes metformin at home    Morbid obesity -BMI 46.90 kg meter square -Counseled for weight loss        Consultants:  Procedures performed:  Disposition: Home Diet recommendation:  Discharge Diet Orders (From admission, onward)      Start     Ordered   07/17/22 0000  Diet - low sodium heart healthy        07/17/22 1150           Cardiac diet DISCHARGE MEDICATION: Allergies as of 07/17/2022       Reactions   Shellfish Allergy Shortness Of Breath        Medication List     STOP taking these medications    ibuprofen 800 MG tablet Commonly known as: ADVIL       TAKE these medications    acetaminophen 500 MG tablet Commonly known as: TYLENOL Take 500 mg by mouth every 6 (six) hours as needed for mild pain, fever or headache.   amLODipine 10 MG tablet Commonly known as: NORVASC Take 1 tablet (10 mg total) by mouth daily. Start taking on: Jul 18, 2022   aspirin EC 325 MG tablet Take 325 mg by mouth daily as needed for mild pain or fever.   b complex vitamins capsule Take 1 capsule by mouth daily.   CO Q 10 PO Take 1 tablet by mouth daily.   ferrous sulfate 325 (65 FE) MG tablet Take 1 tablet (325 mg total) by mouth daily with breakfast.   losartan-hydrochlorothiazide 50-12.5 MG tablet Commonly known as: HYZAAR Take 1 tablet by mouth daily.   MAGNESIUM PO Take 1 tablet by mouth daily.   metFORMIN 500 MG tablet Commonly known as: GLUCOPHAGE Take 500  mg by mouth 2 (two) times daily.   omega-3 acid ethyl esters 1 g capsule Commonly known as: LOVAZA Take 1 g by mouth daily.   spironolactone 25 MG tablet Commonly known as: ALDACTONE Take 0.5 tablets (12.5 mg total) by mouth daily.        Discharge Exam: Filed Weights   07/16/22 0530 07/16/22 1305  Weight: (!) 137 kg (!) 139.9 kg   General-appears in no acute distress Heart-S1-S2, regular, no murmur auscultated Lungs-clear to auscultation bilaterally, no wheezing or crackles auscultated Abdomen-soft, nontender, no organomegaly Extremities-no edema in the lower extremities Neuro-alert, oriented x3, no focal deficit noted  Condition at discharge: good  The results of significant diagnostics from this hospitalization  (including imaging, microbiology, ancillary and laboratory) are listed below for reference.   Imaging Studies: CT Head Wo Contrast  Result Date: 07/16/2022 CLINICAL DATA:  Neuro deficit with acute stroke suspected. Hypertension with lightheadedness EXAM: CT HEAD WITHOUT CONTRAST TECHNIQUE: Contiguous axial images were obtained from the base of the skull through the vertex without intravenous contrast. RADIATION DOSE REDUCTION: This exam was performed according to the departmental dose-optimization program which includes automated exposure control, adjustment of the mA and/or kV according to patient size and/or use of iterative reconstruction technique. COMPARISON:  05/05/2019 FINDINGS: Brain: No evidence of acute infarction, hemorrhage, hydrocephalus, extra-axial collection or mass lesion/mass effect. Vascular: No hyperdense vessel or unexpected calcification. Skull: Normal. Negative for fracture or focal lesion. Sinuses/Orbits: No acute finding. IMPRESSION: No acute finding Electronically Signed   By: Tiburcio Pea M.D.   On: 07/16/2022 06:16   DG Chest 2 View  Result Date: 07/16/2022 CLINICAL DATA:  Chest pain, numbness and lightheadedness. Hypertension. EXAM: CHEST - 2 VIEW COMPARISON:  12/07/2020 FINDINGS: Mild cardiac enlargement. There is no pleural fluid, interstitial edema or airspace disease. No signs of pneumothorax. Visualized osseous structures are unremarkable. IMPRESSION: Mild cardiac enlargement.  No acute abnormality. Electronically Signed   By: Signa Kell M.D.   On: 07/16/2022 06:13    Microbiology: Results for orders placed or performed during the hospital encounter of 07/16/22  MRSA Next Gen by PCR, Nasal     Status: None   Collection Time: 07/16/22  1:11 PM   Specimen: Nasal Mucosa; Nasal Swab  Result Value Ref Range Status   MRSA by PCR Next Gen NOT DETECTED NOT DETECTED Final    Comment: (NOTE) The GeneXpert MRSA Assay (FDA approved for NASAL specimens only), is one  component of a comprehensive MRSA colonization surveillance program. It is not intended to diagnose MRSA infection nor to guide or monitor treatment for MRSA infections. Test performance is not FDA approved in patients less than 70 years old. Performed at [facility], [address] [place], [place] [address]     Labs: CBC: Recent Labs  Lab 07/16/22 0550 07/17/22 0318  WBC 11.5* 9.3  NEUTROABS 7.4  --   HGB 10.9* 10.6*  HCT 35.8* 34.5*  MCV 76.2* 78.1*  PLT 208 197   Basic Metabolic Panel: Recent Labs  Lab 07/16/22 0550 07/17/22 0318  NA 139 137  K 3.2* 3.4*  CL 101 104  CO2 28 25  GLUCOSE 105* 121*  BUN 19 18  CREATININE 1.03* 0.92  CALCIUM 9.3 8.5*   Liver Function Tests: No results for input(s): "AST", "ALT", "ALKPHOS", "BILITOT", "PROT", "ALBUMIN" in the last 168 hours. CBG: No results for input(s): "GLUCAP" in the last 168 hours.  Discharge time spent: greater than 30 minutes.  Signed: Sibyl Parr  Daune Perch, MD [facility] 07/17/2022

## 2022-12-05 ENCOUNTER — Other Ambulatory Visit (HOSPITAL_BASED_OUTPATIENT_CLINIC_OR_DEPARTMENT_OTHER): Payer: Self-pay

## 2022-12-06 ENCOUNTER — Other Ambulatory Visit (HOSPITAL_BASED_OUTPATIENT_CLINIC_OR_DEPARTMENT_OTHER): Payer: Self-pay

## 2022-12-06 MED ORDER — SEMAGLUTIDE-WEIGHT MANAGEMENT 0.25 MG/0.5ML ~~LOC~~ SOAJ
0.2500 mg | SUBCUTANEOUS | 1 refills | Status: DC
  Filled 2022-12-06 (×2): qty 2, 28d supply, fill #0
  Filled 2022-12-20 – 2022-12-28 (×2): qty 2, 28d supply, fill #1

## 2022-12-07 ENCOUNTER — Other Ambulatory Visit (HOSPITAL_BASED_OUTPATIENT_CLINIC_OR_DEPARTMENT_OTHER): Payer: Self-pay

## 2022-12-10 ENCOUNTER — Other Ambulatory Visit (HOSPITAL_BASED_OUTPATIENT_CLINIC_OR_DEPARTMENT_OTHER): Payer: Self-pay

## 2022-12-11 ENCOUNTER — Other Ambulatory Visit (HOSPITAL_BASED_OUTPATIENT_CLINIC_OR_DEPARTMENT_OTHER): Payer: Self-pay

## 2022-12-20 ENCOUNTER — Other Ambulatory Visit (HOSPITAL_BASED_OUTPATIENT_CLINIC_OR_DEPARTMENT_OTHER): Payer: Self-pay

## 2022-12-20 ENCOUNTER — Telehealth: Payer: Self-pay | Admitting: *Deleted

## 2022-12-20 NOTE — Telephone Encounter (Signed)
Left patient a message to call the office prior to scheduled appointment to give and receive information.

## 2022-12-25 NOTE — Progress Notes (Unsigned)
Pt did not show for appt

## 2022-12-26 ENCOUNTER — Ambulatory Visit: Payer: Medicaid Other | Admitting: Obstetrics and Gynecology

## 2022-12-26 DIAGNOSIS — D219 Benign neoplasm of connective and other soft tissue, unspecified: Secondary | ICD-10-CM

## 2022-12-26 DIAGNOSIS — E282 Polycystic ovarian syndrome: Secondary | ICD-10-CM

## 2022-12-28 ENCOUNTER — Other Ambulatory Visit (HOSPITAL_BASED_OUTPATIENT_CLINIC_OR_DEPARTMENT_OTHER): Payer: Self-pay

## 2022-12-28 MED ORDER — WEGOVY 0.5 MG/0.5ML ~~LOC~~ SOAJ
0.5000 mg | SUBCUTANEOUS | 1 refills | Status: DC
  Filled 2022-12-28 – 2023-01-01 (×3): qty 2, 28d supply, fill #0

## 2022-12-31 ENCOUNTER — Other Ambulatory Visit (HOSPITAL_BASED_OUTPATIENT_CLINIC_OR_DEPARTMENT_OTHER): Payer: Self-pay

## 2023-01-01 ENCOUNTER — Other Ambulatory Visit (HOSPITAL_BASED_OUTPATIENT_CLINIC_OR_DEPARTMENT_OTHER): Payer: Self-pay

## 2023-01-01 MED ORDER — LINZESS 145 MCG PO CAPS
145.0000 ug | ORAL_CAPSULE | Freq: Every morning | ORAL | 4 refills | Status: DC
  Filled 2023-01-01: qty 30, 30d supply, fill #0

## 2023-01-01 MED ORDER — POTASSIUM CHLORIDE ER 10 MEQ PO TBCR
10.0000 meq | EXTENDED_RELEASE_TABLET | Freq: Every day | ORAL | 1 refills | Status: DC
  Filled 2023-01-01: qty 90, 90d supply, fill #0

## 2023-01-02 ENCOUNTER — Other Ambulatory Visit: Payer: Self-pay

## 2023-01-02 ENCOUNTER — Other Ambulatory Visit (HOSPITAL_BASED_OUTPATIENT_CLINIC_OR_DEPARTMENT_OTHER): Payer: Self-pay

## 2023-01-03 ENCOUNTER — Other Ambulatory Visit: Payer: Self-pay

## 2023-01-16 ENCOUNTER — Other Ambulatory Visit: Payer: Self-pay

## 2023-01-16 ENCOUNTER — Other Ambulatory Visit (HOSPITAL_BASED_OUTPATIENT_CLINIC_OR_DEPARTMENT_OTHER): Payer: Self-pay

## 2023-01-16 MED ORDER — SPIRONOLACTONE 25 MG PO TABS
25.0000 mg | ORAL_TABLET | Freq: Every day | ORAL | 1 refills | Status: DC
  Filled 2023-01-16: qty 90, 90d supply, fill #0

## 2023-01-16 MED ORDER — LINZESS 72 MCG PO CAPS
72.0000 ug | ORAL_CAPSULE | Freq: Every morning | ORAL | 5 refills | Status: DC
  Filled 2023-01-16: qty 30, 30d supply, fill #0

## 2023-01-24 ENCOUNTER — Other Ambulatory Visit (HOSPITAL_BASED_OUTPATIENT_CLINIC_OR_DEPARTMENT_OTHER): Payer: Self-pay

## 2023-01-24 MED ORDER — WEGOVY 1 MG/0.5ML ~~LOC~~ SOAJ
1.0000 mg | SUBCUTANEOUS | 3 refills | Status: DC
  Filled 2023-01-24: qty 2, 28d supply, fill #0

## 2023-01-25 ENCOUNTER — Other Ambulatory Visit (HOSPITAL_BASED_OUTPATIENT_CLINIC_OR_DEPARTMENT_OTHER): Payer: Self-pay

## 2023-01-25 MED ORDER — AMLODIPINE BESYLATE 10 MG PO TABS
10.0000 mg | ORAL_TABLET | Freq: Every day | ORAL | 1 refills | Status: DC
  Filled 2023-01-25: qty 90, 90d supply, fill #0
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
## 2023-01-31 ENCOUNTER — Ambulatory Visit: Payer: No Typology Code available for payment source | Admitting: Physician Assistant

## 2023-02-01 ENCOUNTER — Other Ambulatory Visit: Payer: Self-pay

## 2023-02-01 ENCOUNTER — Encounter (HOSPITAL_COMMUNITY): Payer: Self-pay

## 2023-02-01 ENCOUNTER — Emergency Department (HOSPITAL_COMMUNITY)
Admission: EM | Admit: 2023-02-01 | Discharge: 2023-02-02 | Disposition: A | Discharge status: Home / Self Care | Payer: Medicaid Other | Attending: Student | Admitting: Student

## 2023-02-01 ENCOUNTER — Emergency Department (HOSPITAL_COMMUNITY): Payer: Medicaid Other

## 2023-02-01 DIAGNOSIS — R42 Dizziness and giddiness: Secondary | ICD-10-CM

## 2023-02-01 DIAGNOSIS — R55 Syncope and collapse: Secondary | ICD-10-CM | POA: Insufficient documentation

## 2023-02-01 DIAGNOSIS — Z79899 Other long term (current) drug therapy: Secondary | ICD-10-CM | POA: Insufficient documentation

## 2023-02-01 DIAGNOSIS — D72829 Elevated white blood cell count, unspecified: Secondary | ICD-10-CM | POA: Diagnosis not present

## 2023-02-01 DIAGNOSIS — E860 Dehydration: Secondary | ICD-10-CM

## 2023-02-01 DIAGNOSIS — I159 Secondary hypertension, unspecified: Secondary | ICD-10-CM

## 2023-02-01 LAB — CBC
HCT: 41.8 % (ref 36.0–46.0)
Hemoglobin: 12.9 g/dL (ref 12.0–15.0)
MCH: 24.2 pg — ABNORMAL LOW (ref 26.0–34.0)
MCHC: 30.9 g/dL (ref 30.0–36.0)
MCV: 78.3 fL — ABNORMAL LOW (ref 80.0–100.0)
Platelets: 296 10*3/uL (ref 150–400)
RBC: 5.34 MIL/uL — ABNORMAL HIGH (ref 3.87–5.11)
RDW: 17.3 % — ABNORMAL HIGH (ref 11.5–15.5)
WBC: 18.7 10*3/uL — ABNORMAL HIGH (ref 4.0–10.5)
nRBC: 0 % (ref 0.0–0.2)

## 2023-02-01 LAB — HCG, SERUM, QUALITATIVE: Preg, Serum: NEGATIVE

## 2023-02-01 NOTE — ED Triage Notes (Signed)
Pt to ED by EMS from home with c/o palpitations and lightheadedness which began an hour ago, tightness of her chest. Pt has a history of HTN, arrives hypertensive.

## 2023-02-02 ENCOUNTER — Other Ambulatory Visit (HOSPITAL_BASED_OUTPATIENT_CLINIC_OR_DEPARTMENT_OTHER): Payer: Self-pay

## 2023-02-02 LAB — TROPONIN I (HIGH SENSITIVITY)
Troponin I (High Sensitivity): 22 ng/L — ABNORMAL HIGH (ref <18)
Troponin I (High Sensitivity): 29 ng/L — ABNORMAL HIGH (ref <18)
Troponin I (High Sensitivity): 33 ng/L — ABNORMAL HIGH (ref <18)

## 2023-02-02 LAB — BASIC METABOLIC PANEL
Anion gap: 11 (ref 5–15)
BUN: 20 mg/dL (ref 6–20)
CO2: 26 mmol/L (ref 22–32)
Calcium: 9.6 mg/dL (ref 8.9–10.3)
Chloride: 101 mmol/L (ref 98–111)
Creatinine, Ser: 1.14 mg/dL — ABNORMAL HIGH (ref 0.44–1.00)
GFR, Estimated: >60 mL/min (ref >60)
Glucose, Bld: 92 mg/dL (ref 70–99)
Potassium: 3.5 mmol/L (ref 3.5–5.1)
Sodium: 138 mmol/L (ref 135–145)

## 2023-02-02 LAB — URINALYSIS, ROUTINE W REFLEX MICROSCOPIC
Bacteria, UA: NONE SEEN
Bilirubin Urine: NEGATIVE
Glucose, UA: NEGATIVE mg/dL
Hgb urine dipstick: NEGATIVE
Ketones, ur: NEGATIVE mg/dL
Leukocytes,Ua: NEGATIVE
Nitrite: NEGATIVE
Protein, ur: 100 mg/dL — AB
Specific Gravity, Urine: 1.011 (ref 1.005–1.030)
pH: 7 (ref 5.0–8.0)

## 2023-02-02 LAB — BRAIN NATRIURETIC PEPTIDE: B Natriuretic Peptide: 80.1 pg/mL (ref 0.0–100.0)

## 2023-02-02 MED ORDER — HYDROCHLOROTHIAZIDE 12.5 MG PO TABS: 12.5000 mg | ORAL_TABLET | Freq: Every day | ORAL | Status: DC

## 2023-02-02 MED ORDER — HYDROCHLOROTHIAZIDE 12.5 MG PO TABS
12.5000 mg | ORAL_TABLET | Freq: Every day | ORAL | Status: DC
  Administered 2023-02-02: 12.5 mg via ORAL
  Filled 2023-02-02: qty 1

## 2023-02-02 MED ORDER — AMLODIPINE BESYLATE 5 MG PO TABS
10.0000 mg | ORAL_TABLET | Freq: Every day | ORAL | Status: DC
  Administered 2023-02-02: 10 mg via ORAL
  Filled 2023-02-02: qty 2

## 2023-02-02 MED ORDER — LOSARTAN POTASSIUM-HCTZ 50-12.5 MG PO TABS: 1.0000 | ORAL_TABLET | Freq: Every day | ORAL | Status: DC

## 2023-02-02 MED ORDER — SPIRONOLACTONE 12.5 MG HALF TABLET: 12.5000 mg | ORAL_TABLET | Freq: Every day | ORAL | Status: DC

## 2023-02-02 MED ORDER — SPIRONOLACTONE 12.5 MG HALF TABLET
12.5000 mg | ORAL_TABLET | Freq: Every day | ORAL | Status: DC
  Administered 2023-02-02: 12.5 mg via ORAL
  Filled 2023-02-02: qty 1

## 2023-02-02 MED ORDER — LACTATED RINGERS IV BOLUS
500.0000 mL | Freq: Once | INTRAVENOUS | Status: AC
  Administered 2023-02-02: 500 mL via INTRAVENOUS

## 2023-02-02 MED ORDER — LOSARTAN POTASSIUM 25 MG PO TABS
50.0000 mg | ORAL_TABLET | Freq: Every day | ORAL | Status: DC
  Administered 2023-02-02: 50 mg via ORAL
  Filled 2023-02-02: qty 2

## 2023-02-02 MED ORDER — ACETAMINOPHEN 500 MG PO TABS
1000.0000 mg | ORAL_TABLET | Freq: Once | ORAL | Status: AC
  Administered 2023-02-02: 1000 mg via ORAL
  Filled 2023-02-02: qty 2

## 2023-02-02 MED ORDER — CARVEDILOL 3.125 MG PO TABS
3.1250 mg | ORAL_TABLET | Freq: Two times a day (BID) | ORAL | 0 refills | Status: DC
  Filled 2023-02-02: qty 30, 15d supply, fill #0

## 2023-02-02 MED ORDER — LOSARTAN POTASSIUM 25 MG PO TABS: 50.0000 mg | ORAL_TABLET | Freq: Every day | ORAL | Status: DC

## 2023-02-02 MED ORDER — AMLODIPINE BESYLATE 5 MG PO TABS: 10.0000 mg | ORAL_TABLET | Freq: Every day | ORAL | Status: DC

## 2023-02-02 NOTE — ED Provider Notes (Addendum)
[place] EMERGENCY DEPARTMENT AT [facility] Provider Note  CSN: [id number] Arrival date & time: 02/01/23 2207  Chief Complaint(s) Palpitations  HPI Tracey Alvarez is a 37 y.o. female with PMH PCOS, obesity, resistant hypertension and recent hospital admission in April 2024 for hypertensive urgency who presents emergency room for evaluation of an episode of presyncope.  She states that she had an episode of palpitations and lightheadedness with a sensation of tightness in her chest feeling like she was going to pass out.  She states that she has been seeing her primary care physician who has been steadily increasing her diuretic therapy and has had progressive thirst.  She states she drinks 4 Stanley cups of water a day and still feels thirsty.  Here in the emergency room, her symptoms have largely resolved but she does arrive quite hypertensive with systolics greater than 200.  She states that over the last few weeks she has seen her blood pressures with systolics in the 180s at baseline despite appropriate compliance with her hypertensive regimen.  Denies associated abdominal pain, nausea, vomiting, headache, fever or other systemic symptoms.   Past Medical History Past Medical History:  Diagnosis Date   Anemia 2012   Depression 2012   POST we loss SURGERY; MEDS X 2 WEEKS, had ppd   Infection 2005   UIT   Infection    YEAST X 1   PCOS (polycystic ovarian syndrome)    Pneumonia    AS TEEN   Pregnancy induced hypertension    Patient Active Problem List   Diagnosis Date Noted   Hypertensive urgency 07/16/2022   PCOS (polycystic ovarian syndrome) 07/16/2022   Status post cesarean delivery 10/16/2012   History of gestational diabetes 10/09/2012   Obesity, Class III, BMI 40-49.9 (morbid obesity) (HCC) 08/11/2012   Hx of laparoscopic partial gastrectomy 08/11/2012   Home Medication(s) Prior to Admission medications   Medication Sig Start Date End Date Taking?  Authorizing Provider  acetaminophen (TYLENOL) 500 MG tablet Take 500 mg by mouth every 6 (six) hours as needed for mild pain, fever or headache.   Yes [provider]  b complex vitamins capsule Take 1 capsule by mouth daily.   Yes [provider]  Coenzyme Q10 (CO Q 10 PO) Take 1 tablet by mouth daily.   Yes [provider]  linaclotide (LINZESS) 145 MCG CAPS capsule Take 1 capsule (145 mcg total) by mouth in the morning. 01/01/23  Yes   linaclotide (LINZESS) 72 MCG capsule Take 1 capsule (72 mcg total) by mouth every morning. 01/16/23  Yes   losartan-hydrochlorothiazide (HYZAAR) 100-25 MG tablet Take 1 tablet by mouth daily. 01/13/23  Yes [provider]  MAGNESIUM PO Take 1 tablet by mouth daily.   Yes [provider]  metFORMIN (GLUCOPHAGE) 500 MG tablet Take 500 mg by mouth 2 (two) times daily.   Yes [provider]  omega-3 acid ethyl esters (LOVAZA) 1 g capsule Take 1 g by mouth daily.   Yes [provider]  potassium chloride (KLOR-CON 10) 10 MEQ tablet Take 1 tablet (10 mEq total) by mouth daily. 01/01/23  Yes   spironolactone (ALDACTONE) 25 MG tablet Take 0.5 tablets (12.5 mg total) by mouth daily. 07/17/22  Yes Meredeth Ide, MD  amLODipine (NORVASC) 10 MG tablet Take 1 tablet (10 mg total) by mouth daily. 07/18/22   Meredeth Ide, MD  Semaglutide-Weight Management (WEGOVY) 0.5 MG/0.5ML SOAJ Inject 0.5 mg into the skin once a week.  Patient not taking: Reported on 02/02/2023 12/28/22     Semaglutide-Weight Management (WEGOVY) 1 MG/0.5ML SOAJ Inject 1 mg into the skin once a week. 01/24/23     Semaglutide-Weight Management 0.25 MG/0.5ML SOAJ Inject 0.25 mg into the skin once a week. Patient not taking: Reported on 02/02/2023 12/05/22                                                                                                                                       Past Surgical History Past Surgical History:  Procedure  Laterality Date   CESAREAN SECTION N/A 10/16/2012   Procedure: Primary Cesarean Section Delivery Baby Girl @ 0328, Apgars 8/9;  Surgeon: Dorien Chihuahua. Richardson Dopp, MD;  Location: [facility];  Service: Obstetrics;  Laterality: N/A;   CESAREAN SECTION N/A 05/02/2018   Procedure: CESAREAN SECTION;  Surgeon: Hoover Browns, MD;  Location: [facility];  Service: Obstetrics;  Laterality: N/A;   TONSILLECTOMY  AGE 41   VERTICAL SLEEVE GASGTRECTOMY  2012   Family History Family History  Problem Relation Age of Onset   Anemia Mother    Seizures Brother        CHILDHOOD   Diabetes Maternal Aunt    Lupus Maternal Aunt    Aneurysm Maternal Uncle    Arthritis Maternal Grandmother    Diabetes Maternal Grandmother    Hyperlipidemia Maternal Grandmother    Hypertension Maternal Grandmother    Stroke Maternal Grandmother    Heart disease Maternal Grandfather    Hyperlipidemia Maternal Grandfather    Stroke Maternal Grandfather    Lupus Cousin     Social History Social History   Tobacco Use   Smoking status: Never   Smokeless tobacco: Never  Vaping Use   Vaping status: Never Used  Substance Use Topics   Alcohol use: Not Currently   Drug use: No   Allergies Shellfish allergy  Review of Systems Review of Systems  Respiratory:  Positive for chest tightness.   Endocrine: Positive for polydipsia.  Neurological:  Positive for light-headedness.    Physical Exam Vital Signs  I have reviewed the triage vital signs BP (!) 177/104   Pulse 88   Temp 98.6 F (37 C) (Oral)   Resp 18   SpO2 100%   Physical Exam Vitals and nursing note reviewed.  Constitutional:      General: She is not in acute distress.    Appearance: She is well-developed.  HENT:     Head: Normocephalic and atraumatic.  Eyes:     Conjunctiva/sclera: Conjunctivae normal.  Cardiovascular:     Rate and Rhythm: Normal rate and regular rhythm.     Heart sounds: No murmur heard. Pulmonary:     Effort: Pulmonary effort is normal.  No respiratory distress.     Breath sounds: Normal breath sounds.  Abdominal:     Palpations: Abdomen is soft.     Tenderness: There is no  abdominal tenderness.  Musculoskeletal:        General: No swelling.     Cervical back: Neck supple.  Skin:    General: Skin is warm and dry.     Capillary Refill: Capillary refill takes less than 2 seconds.  Neurological:     Mental Status: She is alert.  Psychiatric:        Mood and Affect: Mood normal.     ED Results and Treatments Labs (all labs ordered are listed, but only abnormal results are displayed) Labs Reviewed  BASIC METABOLIC PANEL - Abnormal; Notable for the following components:      Result Value   Creatinine, Ser 1.14 (*)    All other components within normal limits  CBC - Abnormal; Notable for the following components:   WBC 18.7 (*)    RBC 5.34 (*)    MCV 78.3 (*)    MCH 24.2 (*)    RDW 17.3 (*)    All other components within normal limits  URINALYSIS, ROUTINE W REFLEX MICROSCOPIC - Abnormal; Notable for the following components:   Color, Urine STRAW (*)    Protein, ur 100 (*)    All other components within normal limits  TROPONIN I (HIGH SENSITIVITY) - Abnormal; Notable for the following components:   Troponin I (High Sensitivity) 22 (*)    All other components within normal limits  TROPONIN I (HIGH SENSITIVITY) - Abnormal; Notable for the following components:   Troponin I (High Sensitivity) 33 (*)    All other components within normal limits  TROPONIN I (HIGH SENSITIVITY) - Abnormal; Notable for the following components:   Troponin I (High Sensitivity) 29 (*)    All other components within normal limits  HCG, SERUM, QUALITATIVE  BRAIN NATRIURETIC PEPTIDE  TROPONIN I (HIGH SENSITIVITY)                                                                                                                          Radiology DG Chest 2 View  Result Date: 02/01/2023 CLINICAL DATA:  Chest pain EXAM: CHEST - 2 VIEW  COMPARISON:  07/16/2022 FINDINGS: The heart size and mediastinal contours are within normal limits. Both lungs are clear. The visualized skeletal structures are unremarkable. IMPRESSION: No active cardiopulmonary disease. Electronically Signed   By: Jasmine Pang M.D.   On: 02/01/2023 22:53    Pertinent labs & imaging results that were available during my care of the patient were reviewed by me and considered in my medical decision making (see MDM for details).  Medications Ordered in ED Medications  amLODipine (NORVASC) tablet 10 mg (10 mg Oral Not Given 02/02/23 0123)  losartan (COZAAR) tablet 50 mg (50 mg Oral Not Given 02/02/23 0123)    And  hydrochlorothiazide (HYDRODIURIL) tablet 12.5 mg (12.5 mg Oral Not Given 02/02/23 0124)  spironolactone (ALDACTONE) tablet 12.5 mg (12.5 mg Oral Given 02/02/23 0128)  lactated ringers bolus 500 mL (0 mLs Intravenous Stopped 02/02/23 0234)  acetaminophen (TYLENOL)  tablet 1,000 mg (1,000 mg Oral Given 02/02/23 0120)                                                                                                                                     Procedures Procedures  (including critical care time)  Medical Decision Making / ED Course   This patient presents to the ED for concern of presyncope, lightheadedness, chest tightness, this involves an extensive number of treatment options, and is a complaint that carries with it a high risk of complications and morbidity.  The differential diagnosis includes orthostatic presyncope, cardiogenic presyncope, vasovagal presyncope, electrolyte abnormality, dehydration, dysrhythmia, vasovagal, Hypoglycemia, Seizure, Autonomic Insufficiency, medication side effect, overdiuresis  MDM: Patient seen emergency room for evaluation of presyncope, lightheadedness, hypertension.  Physical exam largely unremarkable with a normal cardiopulmonary exam, neurologic exam unremarkable.  Laboratory evaluation with a leukocytosis  to 18.7 likely stress demargination from her presyncopal event, urinalysis unremarkable, BNP normal, chest x-ray unremarkable.  Initial has NSTEMI troponin 22 and delta troponin slightly elevated at 33.  Suspect this is likely demand ischemia in the setting of hypertension and patient's home blood pressure regimen was restarted and blood pressure returned to patient's reported recent baseline with systolics in the 170s.  A third troponin was obtained that was appropriately downtrending to 29.  Suspect patient presentation is likely secondary to overdiuresis in the setting of progressively increasing diuretic therapy.  Patient appears to be intolerant to this medication increase and does not appear to be having significant clinical benefit from a hypertension standpoint.  She was fluid resuscitated with 500 cc bolus here and has a follow-up appointment with a new PCP in 6 days.  She will obtain a blood pressure log at home and request a discussion about possibly changing her blood pressure regimen with her PCP.  Patient is currently chest pain-free in the emergency department and I have low suspicion for ACS, low suspicion for aortic dissection especially in the setting of no chest pain or associated neurodeficits.  As blood pressures have returned to patient's recent baseline, symptoms have resolved and patient no longer orthostatic, she currently does not meet inpatient criteria for admission and will be discharged with outpatient follow-up.  She was given strict return precautions of which she voiced understanding and she was discharged with close PCP follow-up next week.  Of note, after the patient was discharged I reviewed the patient's ECG.  This ECG was not handed to me on shift and I was unaware that this was performed.  It does appear that there was a lead malpositioning as the morphologies across multiple leads are drastically different than previous with an axis reversal.  They are not in an ischemic  distribution and I have low suspicion that this indicates ischemia especially with downtrending troponins.   Additional history obtained: -Additional history obtained from husband -External records from outside source obtained and reviewed including: Chart review including  previous notes, labs, imaging, consultation notes   Lab Tests: -I ordered, reviewed, and interpreted labs.   The pertinent results include:   Labs Reviewed  BASIC METABOLIC PANEL - Abnormal; Notable for the following components:      Result Value   Creatinine, Ser 1.14 (*)    All other components within normal limits  CBC - Abnormal; Notable for the following components:   WBC 18.7 (*)    RBC 5.34 (*)    MCV 78.3 (*)    MCH 24.2 (*)    RDW 17.3 (*)    All other components within normal limits  URINALYSIS, ROUTINE W REFLEX MICROSCOPIC - Abnormal; Notable for the following components:   Color, Urine STRAW (*)    Protein, ur 100 (*)    All other components within normal limits  TROPONIN I (HIGH SENSITIVITY) - Abnormal; Notable for the following components:   Troponin I (High Sensitivity) 22 (*)    All other components within normal limits  TROPONIN I (HIGH SENSITIVITY) - Abnormal; Notable for the following components:   Troponin I (High Sensitivity) 33 (*)    All other components within normal limits  TROPONIN I (HIGH SENSITIVITY) - Abnormal; Notable for the following components:   Troponin I (High Sensitivity) 29 (*)    All other components within normal limits  HCG, SERUM, QUALITATIVE  BRAIN NATRIURETIC PEPTIDE  TROPONIN I (HIGH SENSITIVITY)      EKG   EKG Interpretation Date/Time:  Friday February 01 2023 23:16:13 EST Ventricular Rate:  116 PR Interval:  177 QRS Duration:  102 QT Interval:  330 QTC Calculation: 459 R Axis:   107  Text Interpretation: Sinus tachycardia Consider right atrial enlargement suspect lead malpositioning as axis reversed from previous, global morphology change Confirmed  by Clara Herbison ([id number]) on 02/02/2023 6:50:04 AM         Imaging Studies ordered: I ordered imaging studies including chest x-ray I independently visualized and interpreted imaging. I agree with the radiologist interpretation   Medicines ordered and prescription drug management: Meds ordered this encounter  Medications   DISCONTD: amLODipine (NORVASC) tablet 10 mg   DISCONTD: losartan-hydrochlorothiazide (HYZAAR) 50-12.5 MG per tablet 1 tablet   DISCONTD: spironolactone (ALDACTONE) tablet 12.5 mg   lactated ringers bolus 500 mL   DISCONTD: losartan (COZAAR) tablet 50 mg   DISCONTD: hydrochlorothiazide (HYDRODIURIL) tablet 12.5 mg   acetaminophen (TYLENOL) tablet 1,000 mg   amLODipine (NORVASC) tablet 10 mg   AND Linked Order Group    losartan (COZAAR) tablet 50 mg    hydrochlorothiazide (HYDRODIURIL) tablet 12.5 mg   spironolactone (ALDACTONE) tablet 12.5 mg    -I have reviewed the patients home medicines and have made adjustments as needed  Critical interventions none    Cardiac Monitoring: The patient was maintained on a cardiac monitor.  I personally viewed and interpreted the cardiac monitored which showed an underlying rhythm of: NSR  Social Determinants of Health:  Factors impacting patients care include: none   Reevaluation: After the interventions noted above, I reevaluated the patient and found that they have :improved  Co morbidities that complicate the patient evaluation  Past Medical History:  Diagnosis Date   Anemia 2012   Depression 2012   POST we loss SURGERY; MEDS X 2 WEEKS, had ppd   Infection 2005   UIT   Infection    YEAST X 1   PCOS (polycystic ovarian syndrome)    Pneumonia    AS TEEN   Pregnancy induced hypertension  Dispostion: I considered admission for this patient, but at this time she does not meet inpatient criteria for admission and she will be discharged with outpatient PCP follow-up with strict return  precautions     Final Clinical Impression(s) / ED Diagnoses Final diagnoses:  Postural dizziness with presyncope  Secondary hypertension  Dehydration     @PCDICTATION @    Glendora Score, MD 02/02/23 4098    Glendora Score, MD 02/02/23 [phone]

## 2023-02-02 NOTE — ED Notes (Signed)
Pt hypertensive. MD notified. BP meds ordered.

## 2023-02-02 NOTE — ED Notes (Signed)
Urine is in triage

## 2023-02-02 NOTE — Discharge Instructions (Addendum)
You were seen in the emergency room for evaluation of an episode of presyncope with palpitations.  After an extensive ER evaluation, it appears that your symptoms are likely due to increased urination from escalating doses of your diuretic blood pressure medicines.  It seems that you are not tolerating these higher doses very well and may need an alternative approach to blood pressure control.  Please discuss these different blood pressure regimens with your outpatient PCP during your follow-up next week.  Return to the Emergency Department if you have new or worsening chest pain, shortness of breath, passing out or any other concerning symptoms

## 2023-02-03 ENCOUNTER — Emergency Department (HOSPITAL_BASED_OUTPATIENT_CLINIC_OR_DEPARTMENT_OTHER): Payer: Medicaid Other

## 2023-02-03 ENCOUNTER — Encounter (HOSPITAL_BASED_OUTPATIENT_CLINIC_OR_DEPARTMENT_OTHER): Payer: Self-pay | Admitting: Emergency Medicine

## 2023-02-03 ENCOUNTER — Other Ambulatory Visit: Payer: Self-pay

## 2023-02-03 ENCOUNTER — Emergency Department (HOSPITAL_BASED_OUTPATIENT_CLINIC_OR_DEPARTMENT_OTHER)
Admission: EM | Admit: 2023-02-03 | Discharge: 2023-02-04 | Disposition: A | Discharge status: Home / Self Care | Payer: Medicaid Other | Attending: Emergency Medicine | Admitting: Emergency Medicine

## 2023-02-03 DIAGNOSIS — I158 Other secondary hypertension: Secondary | ICD-10-CM | POA: Insufficient documentation

## 2023-02-03 DIAGNOSIS — I10 Essential (primary) hypertension: Secondary | ICD-10-CM

## 2023-02-03 DIAGNOSIS — Z1152 Encounter for screening for COVID-19: Secondary | ICD-10-CM | POA: Diagnosis not present

## 2023-02-03 DIAGNOSIS — Z79899 Other long term (current) drug therapy: Secondary | ICD-10-CM | POA: Insufficient documentation

## 2023-02-03 DIAGNOSIS — R52 Pain, unspecified: Secondary | ICD-10-CM

## 2023-02-03 DIAGNOSIS — R0789 Other chest pain: Secondary | ICD-10-CM

## 2023-02-03 DIAGNOSIS — I159 Secondary hypertension, unspecified: Secondary | ICD-10-CM

## 2023-02-03 DIAGNOSIS — G44209 Tension-type headache, unspecified, not intractable: Secondary | ICD-10-CM

## 2023-02-03 LAB — URINALYSIS, ROUTINE W REFLEX MICROSCOPIC
Bilirubin Urine: NEGATIVE
Glucose, UA: NEGATIVE mg/dL
Ketones, ur: NEGATIVE mg/dL
Leukocytes,Ua: NEGATIVE
Nitrite: NEGATIVE
Protein, ur: NEGATIVE mg/dL
Specific Gravity, Urine: 1.01 (ref 1.005–1.030)
pH: 7 (ref 5.0–8.0)

## 2023-02-03 LAB — CBC WITH DIFFERENTIAL/PLATELET
Abs Immature Granulocytes: 0.08 10*3/uL — ABNORMAL HIGH (ref 0.00–0.07)
Basophils Absolute: 0 10*3/uL (ref 0.0–0.1)
Basophils Relative: 0 %
Eosinophils Absolute: 0 10*3/uL (ref 0.0–0.5)
Eosinophils Relative: 0 %
HCT: 37.5 % (ref 36.0–46.0)
Hemoglobin: 12.2 g/dL (ref 12.0–15.0)
Immature Granulocytes: 1 %
Lymphocytes Relative: 18 %
Lymphs Abs: 3 10*3/uL (ref 0.7–4.0)
MCH: 24.4 pg — ABNORMAL LOW (ref 26.0–34.0)
MCHC: 32.5 g/dL (ref 30.0–36.0)
MCV: 74.9 fL — ABNORMAL LOW (ref 80.0–100.0)
Monocytes Absolute: 0.9 10*3/uL (ref 0.1–1.0)
Monocytes Relative: 5 %
Neutro Abs: 13.2 10*3/uL — ABNORMAL HIGH (ref 1.7–7.7)
Neutrophils Relative %: 76 %
Platelets: 263 10*3/uL (ref 150–400)
RBC: 5.01 MIL/uL (ref 3.87–5.11)
RDW: 16.8 % — ABNORMAL HIGH (ref 11.5–15.5)
WBC: 17.2 10*3/uL — ABNORMAL HIGH (ref 4.0–10.5)
nRBC: 0 % (ref 0.0–0.2)

## 2023-02-03 LAB — COMPREHENSIVE METABOLIC PANEL
ALT: 15 U/L (ref 0–44)
AST: 16 U/L (ref 15–41)
Albumin: 3.8 g/dL (ref 3.5–5.0)
Alkaline Phosphatase: 39 U/L (ref 38–126)
Anion gap: 10 (ref 5–15)
BUN: 19 mg/dL (ref 6–20)
CO2: 25 mmol/L (ref 22–32)
Calcium: 9.2 mg/dL (ref 8.9–10.3)
Chloride: 101 mmol/L (ref 98–111)
Creatinine, Ser: 1.07 mg/dL — ABNORMAL HIGH (ref 0.44–1.00)
GFR, Estimated: >60 mL/min (ref >60)
Glucose, Bld: 104 mg/dL — ABNORMAL HIGH (ref 70–99)
Potassium: 3 mmol/L — ABNORMAL LOW (ref 3.5–5.1)
Sodium: 136 mmol/L (ref 135–145)
Total Bilirubin: 0.5 mg/dL (ref <1.2)
Total Protein: 7.8 g/dL (ref 6.5–8.1)

## 2023-02-03 LAB — MAGNESIUM: Magnesium: 2.1 mg/dL (ref 1.7–2.4)

## 2023-02-03 LAB — RESP PANEL BY RT-PCR (RSV, FLU A&B, COVID)  RVPGX2
Influenza A by PCR: NEGATIVE
Influenza B by PCR: NEGATIVE
Resp Syncytial Virus by PCR: NEGATIVE
SARS Coronavirus 2 by RT PCR: NEGATIVE

## 2023-02-03 LAB — URINALYSIS, MICROSCOPIC (REFLEX)

## 2023-02-03 LAB — CK: Total CK: 215 U/L (ref 38–234)

## 2023-02-03 LAB — CBG MONITORING, ED: Glucose-Capillary: 94 mg/dL (ref 70–99)

## 2023-02-03 LAB — TROPONIN I (HIGH SENSITIVITY): Troponin I (High Sensitivity): 17 ng/L (ref <18)

## 2023-02-03 MED ORDER — LABETALOL HCL 5 MG/ML IV SOLN
20.0000 mg | Freq: Once | INTRAVENOUS | Status: AC
  Administered 2023-02-03: 20 mg via INTRAVENOUS
  Filled 2023-02-03: qty 4

## 2023-02-03 MED ORDER — POTASSIUM CHLORIDE CRYS ER 20 MEQ PO TBCR
40.0000 meq | EXTENDED_RELEASE_TABLET | Freq: Once | ORAL | Status: AC
  Administered 2023-02-04: 40 meq via ORAL
  Filled 2023-02-03: qty 2

## 2023-02-03 MED ORDER — ACETAMINOPHEN 500 MG PO TABS
1000.0000 mg | ORAL_TABLET | Freq: Once | ORAL | Status: AC
  Administered 2023-02-03: 500 mg via ORAL
  Filled 2023-02-03: qty 2

## 2023-02-03 NOTE — ED Triage Notes (Signed)
Pt to ED via EMS with c/o feeling dehydrated; she was seen recently for same; tonight she is concerned about muscle cramps and soreness; sts she feels thirsty even though she is drinking lots of water

## 2023-02-03 NOTE — ED Provider Notes (Signed)
[facility] Provider Note   CSN: [id number] Arrival date & time: 02/03/23  2004     History  Chief Complaint  Patient presents with   Muscle Cramps    Tracey Alvarez is a 37 y.o. female.  HPI   37 year old female with medical history significant for PCOS, depression, hypertension on amlodipine, losartan/hydrochlorothiazide, spironolactone presenting to the emergency department with multiple complaints.  The patient was seen in the emergency department overnight previously with lightheadedness and was found to be hypertensive.  Had been discharged.  Has been having diffuse muscle cramps and has been feeling dehydrated.  Since calling EMS due to concern for diffuse whole body cramping, she developed chest pressure across her chest, lasting for roughly 2 and half hours and has eased off somewhat.  Not exertional, no cough, fever or chills.  No shortness of breath.  She denies any infectious symptoms, no abdominal pain, dysuria or frequency.  She did develop a headache since coming to the emergency department described as a tension type headache across her forehead, came on gradually, no neck pain or stiffness, no thunderclap.  Denies any neurologic deficits.  States that she is not on a statin.  She is on Wegovy and noticed increased thirst since starting the medication.  Home Medications Prior to Admission medications   Medication Sig Start Date End Date Taking? Authorizing Provider  acetaminophen (TYLENOL) 500 MG tablet Take 500 mg by mouth every 6 (six) hours as needed for mild pain, fever or headache.    [provider]  amLODipine (NORVASC) 10 MG tablet Take 1 tablet (10 mg total) by mouth daily. 07/18/22   Meredeth Ide, MD  b complex vitamins capsule Take 1 capsule by mouth daily.    [provider]  carvedilol (COREG) 3.125 MG tablet Take 1 tablet (3.125 mg total) by mouth 2 (two) times daily. 02/02/23     Coenzyme  Q10 (CO Q 10 PO) Take 1 tablet by mouth daily.    [provider]  linaclotide (LINZESS) 145 MCG CAPS capsule Take 1 capsule (145 mcg total) by mouth in the morning. 01/01/23     linaclotide (LINZESS) 72 MCG capsule Take 1 capsule (72 mcg total) by mouth every morning. 01/16/23     losartan-hydrochlorothiazide (HYZAAR) 100-25 MG tablet Take 1 tablet by mouth daily. 01/13/23   [provider]  MAGNESIUM PO Take 1 tablet by mouth daily.    [provider]  metFORMIN (GLUCOPHAGE) 500 MG tablet Take 500 mg by mouth 2 (two) times daily.    [provider]  omega-3 acid ethyl esters (LOVAZA) 1 g capsule Take 1 g by mouth daily.    [provider]  potassium chloride (KLOR-CON 10) 10 MEQ tablet Take 1 tablet (10 mEq total) by mouth daily. 01/01/23     Semaglutide-Weight Management (WEGOVY) 0.5 MG/0.5ML SOAJ Inject 0.5 mg into the skin once a week. Patient not taking: Reported on 02/02/2023 12/28/22     Semaglutide-Weight Management (WEGOVY) 1 MG/0.5ML SOAJ Inject 1 mg into the skin once a week. 01/24/23     Semaglutide-Weight Management 0.25 MG/0.5ML SOAJ Inject 0.25 mg into the skin once a week. Patient not taking: Reported on 02/02/2023 12/05/22     spironolactone (ALDACTONE) 25 MG tablet Take 0.5 tablets (12.5 mg total) by mouth daily. 07/17/22   Meredeth Ide, MD      Allergies    Shellfish allergy    Review of Systems  Review of Systems  Respiratory:  Positive for chest tightness.   Cardiovascular:  Positive for chest pain.  Musculoskeletal:  Positive for myalgias.  All other systems reviewed and are negative.   Physical Exam Updated Vital Signs BP (!) 186/113 (BP Location: Right Arm)   Pulse 97   Temp 98.6 F (37 C)   Resp 20   Ht 5\' 8"  (1.727 m)   Wt (!) 139.7 kg   LMP 02/03/2023   SpO2 97%   BMI 46.83 kg/m  Physical Exam Vitals and nursing note reviewed.  Constitutional:      General: She is not in acute distress.    Appearance:  She is well-developed. She is obese. She is not ill-appearing.  HENT:     Head: Normocephalic and atraumatic.  Eyes:     Conjunctiva/sclera: Conjunctivae normal.  Cardiovascular:     Rate and Rhythm: Normal rate and regular rhythm.  Pulmonary:     Effort: Pulmonary effort is normal. No respiratory distress.     Breath sounds: Normal breath sounds.  Abdominal:     Palpations: Abdomen is soft.     Tenderness: There is no abdominal tenderness.  Musculoskeletal:        General: No swelling.     Cervical back: Normal range of motion and neck supple.     Right lower leg: No edema.     Left lower leg: No edema.  Skin:    General: Skin is warm and dry.     Capillary Refill: Capillary refill takes less than 2 seconds.  Neurological:     Mental Status: She is alert.     Comments: MENTAL STATUS EXAM:    Orientation: Alert and oriented to person, place and time.  Memory: Cooperative, follows commands well.  Language: Speech is clear and language is normal.   CRANIAL NERVES:    CN 2 (Optic): Visual fields intact to confrontation.  CN 3,4,6 (EOM): Pupils equal and reactive to light. Full extraocular eye movement without nystagmus.  CN 5 (Trigeminal): Facial sensation is normal, no weakness of masticatory muscles.  CN 7 (Facial): No facial weakness or asymmetry.  CN 8 (Auditory): Auditory acuity grossly normal.  CN 9,10 (Glossophar): The uvula is midline, the palate elevates symmetrically.  CN 11 (spinal access): Normal sternocleidomastoid and trapezius strength.  CN 12 (Hypoglossal): The tongue is midline. No atrophy or fasciculations.Marland Kitchen   MOTOR:  Muscle Strength: 5/5RUE, 5/5LUE, 5/5RLE, 5/5LLE.   COORDINATION: No tremor  SENSATION:   Intact to light touch all four extremities.    Psychiatric:        Mood and Affect: Mood normal.     ED Results / Procedures / Treatments   Labs (all labs ordered are listed, but only abnormal results are displayed) Labs Reviewed  COMPREHENSIVE  METABOLIC PANEL - Abnormal; Notable for the following components:      Result Value   Potassium 3.0 (*)    Glucose, Bld 104 (*)    Creatinine, Ser 1.07 (*)    All other components within normal limits  CBC WITH DIFFERENTIAL/PLATELET - Abnormal; Notable for the following components:   WBC 17.2 (*)    MCV 74.9 (*)    MCH 24.4 (*)    RDW 16.8 (*)    Neutro Abs 13.2 (*)    Abs Immature Granulocytes 0.08 (*)    All other components within normal limits  RESP PANEL BY RT-PCR (RSV, FLU A&B, COVID)  RVPGX2  MAGNESIUM  CK  URINALYSIS, ROUTINE W  REFLEX MICROSCOPIC  CBG MONITORING, ED  CBG MONITORING, ED  TROPONIN I (HIGH SENSITIVITY)    EKG None  Radiology No results found.  Procedures Procedures    Medications Ordered in ED Medications  acetaminophen (TYLENOL) tablet 1,000 mg (has no administration in time range)  labetalol (NORMODYNE) injection 20 mg (has no administration in time range)    ED Course/ Medical Decision Making/ A&P Clinical Course as of 02/03/23 2306  Sun Feb 03, 2023  2257 BP(!): 186/113 [JL]    Clinical Course User Index [JL] Ernie Avena, MD                                 Medical Decision Making Amount and/or Complexity of Data Reviewed Labs: ordered. Radiology: ordered.  Risk OTC drugs. Prescription drug management.     37 year old female with medical history significant for PCOS, depression, hypertension on amlodipine, losartan/hydrochlorothiazide, spironolactone presenting to the emergency department with multiple complaints.  The patient was seen in the emergency department overnight previously with lightheadedness and was found to be hypertensive.  Had been discharged.  Has been having diffuse muscle cramps and has been feeling dehydrated.  Since calling EMS due to concern for diffuse whole body cramping, she developed chest pressure across her chest, lasting for roughly 2 and half hours and has eased off somewhat.  Not exertional, no  cough, fever or chills.  No shortness of breath.  She denies any infectious symptoms, no abdominal pain, dysuria or frequency.  She did develop a headache since coming to the emergency department described as a tension type headache across her forehead, came on gradually, no neck pain or stiffness, no thunderclap.  Denies any neurologic deficits.  States that she is not on a statin.  She is on Wegovy and noticed increased thirst since starting the medication.  On arrival, the patient was vitally stable, afebrile, not tachycardic heart rate 97, not tachypneic RR 20, hypertensive BP 186/113, saturating 97% on room air.  Sinus rhythm noted on cardiac telemetry.  Physical exam generally unremarkable with a normal neurologic exam, lungs clear to auscultation bilaterally.  Patient is overall well-appearing.  No lower extremity edema.  Plan to evaluate with EKG, chest x-ray, screening labs, patient previously had mildly elevated troponin and uncontrolled blood pressures, will administer IV labetalol, Tylenol for headache and reassess.  Valuate for electrolyte abnormality, rhabdomyolysis, considered less likely.  Given the patient's tension type headache, normal neurologic exam, not thunderclap, do not think CT imaging of the head is indicated.  Chest pain has resolved but did come on acutely in the past few hours, will evaluate with delta troponins. Will plan to follow-up labs, initial CBC revealed a nonspecific leukocytosis 17.2 which is chronic, no anemia, initial CK normal, magnesium normal. Signout given to Dr. Pilar Plate pending results of diagnostic testing and reassessment.   Final Clinical Impression(s) / ED Diagnoses Final diagnoses:  Secondary hypertension  Chest pressure  Acute non intractable tension-type headache    Rx / DC Orders ED Discharge Orders     None         Ernie Avena, MD 02/03/23 2306

## 2023-02-03 NOTE — ED Notes (Signed)
Attempt x 1 to draw labs by [place], NT +3, unable to obtain

## 2023-02-03 NOTE — ED Provider Notes (Signed)
  Provider Note MRN:  [id number]  Arrival date & time: 02/04/23    ED Course and Medical Decision Making  Assumed care from Dr. Karene Fry at shift change.  Muscle aches with chest pain awaiting troponin.  12:30 AM update: Troponin is negative, patient is in no acute distress, feeling better, rest of workup is reassuring.  No indication for further testing or admission appropriate for discharge.  Procedures  Final Clinical Impressions(s) / ED Diagnoses     ICD-10-CM   1. Chest pressure  R07.89     2. Acute non intractable tension-type headache  G44.209     3. Hypertension, unspecified type  I10     4. Body aches  R52       ED Discharge Orders     None         Discharge Instructions      You were evaluated in the Emergency Department and after careful evaluation, we did not find any emergent condition requiring admission or further testing in the hospital.  Your exam/testing today is overall reassuring.  Recommend continued follow-up with your primary care doctor to discuss your symptoms and your blood pressure.  Please return to the Emergency Department if you experience any worsening of your condition.   Thank you for allowing [place] to be a part of your care.      Elmer Sow. Pilar Plate, MD [facility] Emergency Medicine [facility] [email or web address]    Sabas Sous, MD 02/04/23 [phone]

## 2023-02-04 ENCOUNTER — Other Ambulatory Visit (HOSPITAL_BASED_OUTPATIENT_CLINIC_OR_DEPARTMENT_OTHER): Payer: Self-pay

## 2023-02-04 NOTE — Discharge Instructions (Signed)
You were evaluated in the Emergency Department and after careful evaluation, we did not find any emergent condition requiring admission or further testing in the hospital.  Your exam/testing today is overall reassuring.  Recommend continued follow-up with your primary care doctor to discuss your symptoms and your blood pressure.  Please return to the Emergency Department if you experience any worsening of your condition.   Thank you for allowing [place] to be a part of your care.

## 2023-02-06 ENCOUNTER — Other Ambulatory Visit (HOSPITAL_COMMUNITY): Payer: Self-pay

## 2023-02-06 ENCOUNTER — Other Ambulatory Visit (HOSPITAL_BASED_OUTPATIENT_CLINIC_OR_DEPARTMENT_OTHER): Payer: Self-pay

## 2023-02-06 ENCOUNTER — Encounter: Payer: Self-pay | Admitting: Physician Assistant

## 2023-02-06 ENCOUNTER — Ambulatory Visit: Payer: Medicaid Other | Admitting: Physician Assistant

## 2023-02-06 VITALS — BP 156/101 | HR 100 | Ht 68.0 in | Wt 301.6 lb

## 2023-02-06 DIAGNOSIS — E876 Hypokalemia: Secondary | ICD-10-CM | POA: Insufficient documentation

## 2023-02-06 DIAGNOSIS — F419 Anxiety disorder, unspecified: Secondary | ICD-10-CM | POA: Insufficient documentation

## 2023-02-06 DIAGNOSIS — I160 Hypertensive urgency: Secondary | ICD-10-CM | POA: Diagnosis not present

## 2023-02-06 DIAGNOSIS — R631 Polydipsia: Secondary | ICD-10-CM | POA: Diagnosis not present

## 2023-02-06 MED ORDER — HYDRALAZINE HCL 10 MG PO TABS
10.0000 mg | ORAL_TABLET | Freq: Three times a day (TID) | ORAL | 0 refills | Status: DC
  Filled 2023-02-06: qty 90, 30d supply, fill #0

## 2023-02-06 MED ORDER — ESCITALOPRAM OXALATE 10 MG PO TABS
10.0000 mg | ORAL_TABLET | Freq: Every day | ORAL | 1 refills | Status: DC
  Filled 2023-02-06 (×2): qty 90, 90d supply, fill #0

## 2023-02-06 NOTE — Assessment & Plan Note (Signed)
Chronic anxiety exacerbated by recent health issues and elevated blood pressure. Patient interested in both medication and therapy options. - Prescribe Lexapro 10 mg daily, advised SE - Refer to therapist for counseling

## 2023-02-06 NOTE — Assessment & Plan Note (Signed)
-   Recheck potassium levels - Adjust treatment based on results

## 2023-02-06 NOTE — Assessment & Plan Note (Addendum)
Will check urine osmo, repeat cmp and monitor. Will check A1c-- but pt on wegovy 1 mg unlikely elevated
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
## 2023-02-06 NOTE — Patient Instructions (Addendum)
Virtual therapy [email or web address]  Emergency Mental Health Services:  Crisis Hotline: 88  HandlingCost.fr  Substance Abuse and Mental Health Services Administration [facility]) Hotline:  817-529-1831 4192513760)  [facility] (like an urgent care for mental health) [address], [place], [place] [address] [phone]  Therapy/ Psychiatry Offices:  [facility] [address] [place],  [place]  [address] [phone] [email or web address]  [facility] [address]  [place], [place] [address] [phone] [email or web address]  [facility] [address] [place], [place] [address] [phone] [email or web address]  [facility]  [address], [place] [address] [phone] [email or web address]  Dr. Milagros Evener  [address] [place] [address] [phone]       [email or web address]  [facility]  [address] [place] [address]. [phone] [email or web address]  [facility] [address] [place], [place] [address] [phone]  [address] [place], [place] [address] [phone] [email or web address]  [facility] [address] [place], [place] [address] [phone] [email or web address]

## 2023-02-06 NOTE — Assessment & Plan Note (Addendum)
Uncontrolled Pt managed on losartan 100 mg, hydrochlorothiazide 25 mg, and amlodipine 10 mg Adding hydralazine 10 mg tid. Cautioned hypotension, pt advised to check bp at home.  Repeat cmp, f/b 1-2 weeks

## 2023-02-07 ENCOUNTER — Other Ambulatory Visit: Payer: Self-pay | Admitting: Physician Assistant

## 2023-02-07 ENCOUNTER — Other Ambulatory Visit: Payer: Self-pay

## 2023-02-07 ENCOUNTER — Other Ambulatory Visit (HOSPITAL_BASED_OUTPATIENT_CLINIC_OR_DEPARTMENT_OTHER): Payer: Self-pay

## 2023-02-07 DIAGNOSIS — N179 Acute kidney failure, unspecified: Secondary | ICD-10-CM

## 2023-02-07 DIAGNOSIS — R631 Polydipsia: Secondary | ICD-10-CM

## 2023-02-07 DIAGNOSIS — I160 Hypertensive urgency: Secondary | ICD-10-CM

## 2023-02-07 LAB — CBC WITH DIFFERENTIAL/PLATELET
Basophils Absolute: 0.1 10*3/uL (ref 0.0–0.1)
Basophils Relative: 0.5 % (ref 0.0–3.0)
Eosinophils Absolute: 0 10*3/uL (ref 0.0–0.7)
Eosinophils Relative: 0.2 % (ref 0.0–5.0)
HCT: 41.2 % (ref 36.0–46.0)
Hemoglobin: 13 g/dL (ref 12.0–15.0)
Lymphocytes Relative: 19.4 % (ref 12.0–46.0)
Lymphs Abs: 3.5 10*3/uL (ref 0.7–4.0)
MCHC: 31.5 g/dL (ref 30.0–36.0)
MCV: 78 fL (ref 78.0–100.0)
Monocytes Absolute: 1.1 10*3/uL — ABNORMAL HIGH (ref 0.1–1.0)
Monocytes Relative: 6 % (ref 3.0–12.0)
Neutro Abs: 13.2 10*3/uL — ABNORMAL HIGH (ref 1.4–7.7)
Neutrophils Relative %: 73.9 % (ref 43.0–77.0)
Platelets: 290 10*3/uL (ref 150.0–400.0)
RBC: 5.27 Mil/uL — ABNORMAL HIGH (ref 3.87–5.11)
RDW: 17.4 % — ABNORMAL HIGH (ref 11.5–15.5)
WBC: 17.8 10*3/uL — ABNORMAL HIGH (ref 4.0–10.5)

## 2023-02-07 LAB — COMPREHENSIVE METABOLIC PANEL
ALT: 14 U/L (ref 0–35)
AST: 13 U/L (ref 0–37)
Albumin: 4.8 g/dL (ref 3.5–5.2)
Alkaline Phosphatase: 49 U/L (ref 39–117)
BUN: 22 mg/dL (ref 6–23)
CO2: 31 meq/L (ref 19–32)
Calcium: 10.2 mg/dL (ref 8.4–10.5)
Chloride: 98 meq/L (ref 96–112)
Creatinine, Ser: 1.19 mg/dL (ref 0.40–1.20)
GFR: 57.92 mL/min — ABNORMAL LOW (ref >60.00)
Glucose, Bld: 81 mg/dL (ref 70–99)
Potassium: 3.7 meq/L (ref 3.5–5.1)
Sodium: 139 meq/L (ref 135–145)
Total Bilirubin: 0.4 mg/dL (ref 0.2–1.2)
Total Protein: 8.2 g/dL (ref 6.0–8.3)

## 2023-02-07 LAB — HEMOGLOBIN A1C: Hgb A1c MFr Bld: 6.1 % (ref 4.6–6.5)

## 2023-02-08 ENCOUNTER — Other Ambulatory Visit: Payer: Self-pay | Admitting: Physician Assistant

## 2023-02-08 ENCOUNTER — Ambulatory Visit: Payer: No Typology Code available for payment source | Admitting: Physician Assistant

## 2023-02-08 LAB — OSMOLALITY, URINE: Osmolality, Ur: 219 mosm/kg (ref 50–1200)

## 2023-02-11 ENCOUNTER — Other Ambulatory Visit (HOSPITAL_BASED_OUTPATIENT_CLINIC_OR_DEPARTMENT_OTHER): Payer: Self-pay

## 2023-02-11 MED ORDER — LOSARTAN POTASSIUM-HCTZ 100-25 MG PO TABS
1.0000 | ORAL_TABLET | Freq: Every day | ORAL | 1 refills | Status: DC
  Filled 2023-02-11: qty 90, 90d supply, fill #0

## 2023-02-12 NOTE — Progress Notes (Unsigned)
      Established patient visit   Patient: Tracey Alvarez   DOB: 04/12/1985   37 y.o. Female  MRN: [id number] Visit Date: 02/13/2023  Today's healthcare provider: Alfredia Ferguson, PA-C   No chief complaint on file.  Subjective     ***  Medications: Outpatient Medications Prior to Visit  Medication Sig   amLODipine (NORVASC) 10 MG tablet Take 1 tablet (10 mg total) by mouth daily.   b complex vitamins capsule Take 1 capsule by mouth daily.   Coenzyme Q10 (CO Q 10 PO) Take 1 tablet by mouth daily. (Patient not taking: Reported on 02/06/2023)   escitalopram (LEXAPRO) 10 MG tablet Take 1 tablet (10 mg total) by mouth daily.   hydrALAZINE (APRESOLINE) 10 MG tablet Take 1 tablet (10 mg total) by mouth 3 (three) times daily.   linaclotide (LINZESS) 145 MCG CAPS capsule Take 1 capsule (145 mcg total) by mouth in the morning.   linaclotide (LINZESS) 72 MCG capsule Take 1 capsule (72 mcg total) by mouth every morning.   losartan-hydrochlorothiazide (HYZAAR) 100-25 MG tablet Take 1 tablet by mouth daily.   MAGNESIUM PO Take 1 tablet by mouth daily.   metFORMIN (GLUCOPHAGE) 500 MG tablet Take 500 mg by mouth 2 (two) times daily.   omega-3 acid ethyl esters (LOVAZA) 1 g capsule Take 1 g by mouth daily.   potassium chloride (KLOR-CON 10) 10 MEQ tablet Take 1 tablet (10 mEq total) by mouth daily. (Patient not taking: Reported on 02/06/2023)   Semaglutide-Weight Management (WEGOVY) 1 MG/0.5ML SOAJ Inject 1 mg into the skin once a week.   No facility-administered medications prior to visit.    Review of Systems {Insert previous labs (optional):23779} {See past labs  Heme  Chem  Endocrine  Serology  Results Review (optional):1}   Objective    LMP 02/03/2023 (Exact Date)  {Insert last BP/Wt (optional):23777}{See vitals history (optional):1}  Physical Exam  ***  No results found for any visits on 02/13/23.  Assessment & Plan    There are no diagnoses linked to this  encounter.  ***  No follow-ups on file.       Alfredia Ferguson, PA-C  [facility] at [facility] [phone] (phone) [phone] (fax)  [facility]

## 2023-02-13 ENCOUNTER — Ambulatory Visit: Payer: Medicaid Other | Admitting: Physician Assistant

## 2023-02-13 ENCOUNTER — Other Ambulatory Visit (HOSPITAL_BASED_OUTPATIENT_CLINIC_OR_DEPARTMENT_OTHER): Payer: Self-pay

## 2023-02-13 VITALS — BP 125/68 | HR 93 | Temp 98.4°F | Ht 68.0 in | Wt 307.0 lb

## 2023-02-13 DIAGNOSIS — D72829 Elevated white blood cell count, unspecified: Secondary | ICD-10-CM | POA: Insufficient documentation

## 2023-02-13 DIAGNOSIS — F419 Anxiety disorder, unspecified: Secondary | ICD-10-CM | POA: Diagnosis not present

## 2023-02-13 DIAGNOSIS — I10 Essential (primary) hypertension: Secondary | ICD-10-CM

## 2023-02-13 MED ORDER — WEGOVY 1.7 MG/0.75ML ~~LOC~~ SOAJ
1.7000 mg | SUBCUTANEOUS | 1 refills | Status: DC
  Filled 2023-02-13 – 2023-02-18 (×2): qty 3, 28d supply, fill #0

## 2023-02-13 NOTE — Assessment & Plan Note (Signed)
Managed on wegovy-- increasing to 1.7 mg  F/u 2-3 mo

## 2023-02-13 NOTE — Assessment & Plan Note (Signed)
Persistent on labs-- repeat cbc in 2 weeks

## 2023-02-13 NOTE — Assessment & Plan Note (Signed)
Now controlled with Losartan 100 mg  Hydrochlorothiazide 25 mg  Amlodipine 10 mg and  Hydralazine 10 mg tid -- recommending BID d/t lower BP at home Thoughts bp 2/2 to anxiety. Advised ok to continue to dec hydralazine dose if seeing < 130/80 at home consistently. F/u 2-3 mo

## 2023-02-13 NOTE — Assessment & Plan Note (Signed)
Hesitant to start Lexapro due to side effect concerns. Emphasized benefits and option to discontinue if adverse effects occur. Shared personal experience with Lexapro. - Consider starting Lexapro 10 mg daily, with the option to halve the dose if apprehensive

## 2023-02-18 ENCOUNTER — Other Ambulatory Visit (HOSPITAL_BASED_OUTPATIENT_CLINIC_OR_DEPARTMENT_OTHER): Payer: Self-pay

## 2023-02-21 ENCOUNTER — Ambulatory Visit: Payer: Medicaid Other | Admitting: Physician Assistant

## 2023-02-22 ENCOUNTER — Other Ambulatory Visit: Payer: Self-pay | Admitting: Physician Assistant

## 2023-02-22 ENCOUNTER — Other Ambulatory Visit (HOSPITAL_BASED_OUTPATIENT_CLINIC_OR_DEPARTMENT_OTHER): Payer: Self-pay

## 2023-02-22 ENCOUNTER — Encounter: Payer: Self-pay | Admitting: Physician Assistant

## 2023-02-22 MED ORDER — METFORMIN HCL 500 MG PO TABS
500.0000 mg | ORAL_TABLET | Freq: Two times a day (BID) | ORAL | 0 refills | Status: DC
  Filled 2023-02-22: qty 60, 30d supply, fill #0

## 2023-02-25 ENCOUNTER — Other Ambulatory Visit: Payer: Self-pay | Admitting: Physician Assistant

## 2023-02-25 ENCOUNTER — Other Ambulatory Visit (INDEPENDENT_AMBULATORY_CARE_PROVIDER_SITE_OTHER): Payer: Medicaid Other

## 2023-02-25 DIAGNOSIS — I10 Essential (primary) hypertension: Secondary | ICD-10-CM | POA: Diagnosis not present

## 2023-02-25 DIAGNOSIS — D72829 Elevated white blood cell count, unspecified: Secondary | ICD-10-CM

## 2023-02-25 DIAGNOSIS — I160 Hypertensive urgency: Secondary | ICD-10-CM

## 2023-02-25 LAB — COMPREHENSIVE METABOLIC PANEL
ALT: 10 U/L (ref 0–35)
AST: 10 U/L (ref 0–37)
Albumin: 4.1 g/dL (ref 3.5–5.2)
Alkaline Phosphatase: 48 U/L (ref 39–117)
BUN: 22 mg/dL (ref 6–23)
CO2: 31 meq/L (ref 19–32)
Calcium: 9.4 mg/dL (ref 8.4–10.5)
Chloride: 101 meq/L (ref 96–112)
Creatinine, Ser: 0.94 mg/dL (ref 0.40–1.20)
GFR: 76.83 mL/min (ref >60.00)
Glucose, Bld: 113 mg/dL — ABNORMAL HIGH (ref 70–99)
Potassium: 3.4 meq/L — ABNORMAL LOW (ref 3.5–5.1)
Sodium: 141 meq/L (ref 135–145)
Total Bilirubin: 0.3 mg/dL (ref 0.2–1.2)
Total Protein: 7.1 g/dL (ref 6.0–8.3)

## 2023-02-25 LAB — CBC WITH DIFFERENTIAL/PLATELET
Basophils Absolute: 0.1 10*3/uL (ref 0.0–0.1)
Basophils Relative: 0.4 % (ref 0.0–3.0)
Eosinophils Absolute: 0 10*3/uL (ref 0.0–0.7)
Eosinophils Relative: 0.3 % (ref 0.0–5.0)
HCT: 36.4 % (ref 36.0–46.0)
Hemoglobin: 11.5 g/dL — ABNORMAL LOW (ref 12.0–15.0)
Lymphocytes Relative: 22.4 % (ref 12.0–46.0)
Lymphs Abs: 3.2 10*3/uL (ref 0.7–4.0)
MCHC: 31.7 g/dL (ref 30.0–36.0)
MCV: 76.7 fL — ABNORMAL LOW (ref 78.0–100.0)
Monocytes Absolute: 0.6 10*3/uL (ref 0.1–1.0)
Monocytes Relative: 4.1 % (ref 3.0–12.0)
Neutro Abs: 10.2 10*3/uL — ABNORMAL HIGH (ref 1.4–7.7)
Neutrophils Relative %: 72.8 % (ref 43.0–77.0)
Platelets: 260 10*3/uL (ref 150.0–400.0)
RBC: 4.75 Mil/uL (ref 3.87–5.11)
RDW: 17.2 % — ABNORMAL HIGH (ref 11.5–15.5)
WBC: 14.1 10*3/uL — ABNORMAL HIGH (ref 4.0–10.5)

## 2023-02-25 MED ORDER — LOSARTAN POTASSIUM 50 MG PO TABS: 50.0000 mg | ORAL_TABLET | Freq: Every day | ORAL | 1 refills | Status: DC

## 2023-02-25 MED ORDER — LOSARTAN POTASSIUM 100 MG PO TABS: 100.0000 mg | ORAL_TABLET | Freq: Every day | ORAL | 1 refills | Status: DC

## 2023-02-28 ENCOUNTER — Encounter: Payer: Self-pay | Admitting: Physician Assistant

## 2023-03-19 ENCOUNTER — Telehealth: Payer: Self-pay | Admitting: *Deleted

## 2023-03-19 NOTE — Telephone Encounter (Signed)
Left patient a message with appointment information. 

## 2023-03-22 ENCOUNTER — Other Ambulatory Visit: Payer: Self-pay | Admitting: Physician Assistant

## 2023-03-22 MED ORDER — ZEPBOUND 5 MG/0.5ML ~~LOC~~ SOAJ: 5.0000 mg | SUBCUTANEOUS | 3 refills | Status: DC

## 2023-03-22 MED ORDER — WEGOVY 2.4 MG/0.75ML ~~LOC~~ SOAJ: 2.4000 mg | SUBCUTANEOUS | 3 refills | Status: DC

## 2023-03-22 NOTE — Progress Notes (Signed)
 Patient is gaining weight, A1c is increasing on 1.7 mg of wegovy.  Last BMI 02/13/23 46.69, 307 lbs.  Will increase to 2.5 mg of wegovy but patient is interested in switching to Zepbound.  Would switch from wegovy dose to 5 mg of Zepbound.

## 2023-03-27 NOTE — Progress Notes (Deleted)
   GYNECOLOGY OFFICE VISIT NOTE  History:   Tracey Alvarez is a 38 y.o. [id number] here today for ***.       The following portions of the patient's history were reviewed and updated as appropriate: allergies, current medications, past family history, past medical history, past social history, past surgical history and problem list.   Health Maintenance:   Normal pap and negative HRHPV on 01/2021. Done at [facility].   Review of Systems:  Pertinent items noted in HPI and remainder of comprehensive ROS otherwise negative.  Physical Exam:  There were no vitals taken for this visit. CONSTITUTIONAL: Well-developed, well-nourished female in no acute distress.  HEENT:  Normocephalic, atraumatic. External right and left ear normal. No scleral icterus.  NECK: Normal range of motion, supple, no masses noted on observation SKIN: No rash noted. Not diaphoretic. No erythema. No pallor. MUSCULOSKELETAL: Normal range of motion. No edema noted. NEUROLOGIC: Alert and oriented to person, place, and time. Normal muscle tone coordination. No cranial nerve deficit noted. PSYCHIATRIC: Normal mood and affect. Normal behavior. Normal judgment and thought content.  PELVIC: {Blank single:19197::Deferred,Normal appearing external genitalia; normal urethral meatus; normal appearing vaginal mucosa and cervix.  No abnormal discharge noted.  Normal uterine size, no other palpable masses, no uterine or adnexal tenderness. Performed in the presence of a chaperone}  Labs and Imaging No results found for this or any previous visit (from the past week). No results found.  Assessment and Plan:   1. Vulvar lump (Primary) ***    Diagnoses and all orders for this visit:  Vulvar lump     No orders of the defined types were placed in this encounter.    Routine preventative health maintenance measures emphasized. Please refer to After Visit Summary for other counseling recommendations.   No follow-ups on  file.  Vina Solian, MD, FACOG Obstetrician & Gynecologist, [facility] for [facility], [facility]

## 2023-03-28 ENCOUNTER — Encounter: Payer: Medicaid Other | Admitting: Obstetrics and Gynecology

## 2023-03-28 DIAGNOSIS — N9089 Other specified noninflammatory disorders of vulva and perineum: Secondary | ICD-10-CM

## 2023-04-01 ENCOUNTER — Other Ambulatory Visit (HOSPITAL_COMMUNITY): Payer: Self-pay

## 2023-04-08 ENCOUNTER — Other Ambulatory Visit (HOSPITAL_COMMUNITY): Payer: Self-pay

## 2023-04-08 ENCOUNTER — Other Ambulatory Visit: Payer: Self-pay

## 2023-04-08 ENCOUNTER — Other Ambulatory Visit: Payer: Self-pay | Admitting: Physician Assistant

## 2023-04-08 MED ORDER — ONDANSETRON HCL 4 MG PO TABS: 4.0000 mg | ORAL_TABLET | Freq: Three times a day (TID) | ORAL | 1 refills | Status: AC | PRN

## 2023-04-08 MED ORDER — AMLODIPINE BESYLATE 5 MG PO TABS
5.0000 mg | ORAL_TABLET | Freq: Every day | ORAL | 0 refills | Status: DC
  Filled 2023-04-08 – 2023-05-03 (×3): qty 90, 90d supply, fill #0

## 2023-04-09 ENCOUNTER — Other Ambulatory Visit: Payer: Self-pay

## 2023-04-09 ENCOUNTER — Other Ambulatory Visit: Payer: Self-pay | Admitting: Physician Assistant

## 2023-04-09 MED ORDER — ZEPBOUND 7.5 MG/0.5ML ~~LOC~~ SOAJ: 7.5000 mg | SUBCUTANEOUS | 2 refills | Status: DC

## 2023-04-12 ENCOUNTER — Other Ambulatory Visit: Payer: Self-pay

## 2023-04-23 ENCOUNTER — Other Ambulatory Visit: Payer: Self-pay | Admitting: Physician Assistant

## 2023-04-23 DIAGNOSIS — I160 Hypertensive urgency: Secondary | ICD-10-CM

## 2023-04-23 NOTE — Telephone Encounter (Signed)
   Notes to clinic: Rx sent to [facility] refill pool- do not fill for this provider- Rx forwarded to correct office  Requested Prescriptions  Pending Prescriptions Disp Refills   hydrALAZINE  (APRESOLINE ) 10 MG tablet [Pharmacy Med Name: hydrALAZINE  HCl 10 MG Oral Tablet] 90 tablet 0    Sig: TAKE 1 TABLET BY MOUTH THREE TIMES DAILY     There is no refill protocol information for this order       Requested Prescriptions  Pending Prescriptions Disp Refills   hydrALAZINE  (APRESOLINE ) 10 MG tablet [Pharmacy Med Name: hydrALAZINE  HCl 10 MG Oral Tablet] 90 tablet 0    Sig: TAKE 1 TABLET BY MOUTH THREE TIMES DAILY     There is no refill protocol information for this order

## 2023-04-25 ENCOUNTER — Other Ambulatory Visit: Payer: Self-pay | Admitting: Physician Assistant

## 2023-04-25 MED ORDER — ZEPBOUND 10 MG/0.5ML ~~LOC~~ SOAJ: 10.0000 mg | SUBCUTANEOUS | 1 refills | Status: DC

## 2023-04-26 NOTE — Progress Notes (Signed)
 Established patient visit   Patient: Tracey Alvarez   DOB: 1985-06-30   38 y.o. Female  MRN: [id number] Visit Date: 04/29/2023  Today's healthcare provider: Trenton Frock, PA-C   Chief Complaint  Patient presents with   Medical Management of Chronic Issues    HTN- Recheck labs- patient not fasting.  No concerns-   Subjective    Discussed the use of AI scribe software for clinical note transcription with the patient, who gave verbal consent to proceed.  Hypertension, follow-up  BP Readings from Last 3 Encounters:  04/29/23 (!) 144/94  02/13/23 125/68  02/06/23 (!) 156/101   Wt Readings from Last 3 Encounters:  04/29/23 299 lb 4 oz (135.7 kg)  02/13/23 (!) 307 lb (139.3 kg)  02/06/23 (!) 301 lb 9.6 oz (136.8 kg)     Outside blood pressures are 130s-140s / 90s-100s.   Pt is intermittently taking hydralazine  10 mg TID. She reports her BP is only in range ( 120s/80s) when she takes it TID. She does not take hydralazine  consistently 2/2 peripheral edema.   Intolerant to hydrochlorothiazide . Otherwise managing on losartan  100 mg and amlodipine  5 mg.   She has noticed that the losartan  does not seem to have a significant effect on her blood pressure-- meaning when she takes it, she doesn't see an immediate change in numbers.  The patient has been researching other medication options and is considering switching from losartan  to telmisartan  due to potential additional benefits such as reducing inflammation and helping with insulin resistance.       Pertinent labs No results found for: "CHOL", "HDL", "LDLCALC", "LDLDIRECT", "TRIG", "CHOLHDL" Lab Results  Component Value Date   NA 141 02/25/2023   K 3.4 (L) 02/25/2023   CREATININE 0.94 02/25/2023   GFRNONAA >60 02/03/2023   GLUCOSE 113 (H) 02/25/2023   TSH 1.116 11/16/2017     The ASCVD Risk score (Arnett DK, et al., 2019) failed to calculate for the following reasons:   The 2019 ASCVD risk score is only valid  for ages 34 to 11  ---------------------------------------------------------------------------------------------------   Medications: Outpatient Medications Prior to Visit  Medication Sig   amLODipine  (NORVASC ) 5 MG tablet Take 1 tablet (5 mg total) by mouth daily.   b complex vitamins capsule Take 1 capsule by mouth daily.   escitalopram  (LEXAPRO ) 10 MG tablet Take 1 tablet (10 mg total) by mouth daily.   hydrALAZINE  (APRESOLINE ) 10 MG tablet Take 1 tablet (10 mg total) by mouth 3 (three) times daily.   linaclotide  (LINZESS ) 145 MCG CAPS capsule Take 1 capsule (145 mcg total) by mouth in the morning.   linaclotide  (LINZESS ) 72 MCG capsule Take 1 capsule (72 mcg total) by mouth every morning.   losartan  (COZAAR ) 100 MG tablet Take 1 tablet (100 mg total) by mouth daily.   MAGNESIUM  PO Take 1 tablet by mouth daily.   metFORMIN  (GLUCOPHAGE ) 500 MG tablet Take 1 tablet (500 mg total) by mouth 2 (two) times daily. For more refills patient will need a follow up appointment.   omega-3 acid ethyl esters (LOVAZA) 1 g capsule Take 1 g by mouth daily.   ondansetron  (ZOFRAN ) 4 MG tablet Take 1 tablet (4 mg total) by mouth every 8 (eight) hours as needed for nausea or vomiting.   tirzepatide  (ZEPBOUND ) 10 MG/0.5ML Pen Inject 10 mg into the skin once a week.   No facility-administered medications prior to visit.    Review of Systems  Constitutional:  Negative for  fatigue and fever.  Respiratory:  Negative for cough and shortness of breath.   Cardiovascular:  Negative for chest pain and leg swelling.  Gastrointestinal:  Negative for abdominal pain.  Neurological:  Negative for dizziness and headaches.       Objective    BP (!) 144/94 Comment: home value  Pulse 95   Temp 98.5 F (36.9 C) (Oral)   Ht 5\' 8"  (1.727 m)   Wt 299 lb 4 oz (135.7 kg)   SpO2 97%   BMI 45.50 kg/m    Physical Exam Constitutional:      General: She is awake.     Appearance: She is well-developed.  HENT:      Head: Normocephalic.  Eyes:     Conjunctiva/sclera: Conjunctivae normal.  Cardiovascular:     Rate and Rhythm: Normal rate and regular rhythm.     Heart sounds: Normal heart sounds.  Pulmonary:     Effort: Pulmonary effort is normal.  Musculoskeletal:     Right lower leg: No edema.     Left lower leg: No edema.  Skin:    General: Skin is warm.  Neurological:     Mental Status: She is alert and oriented to person, place, and time.  Psychiatric:        Attention and Perception: Attention normal.        Mood and Affect: Mood normal.        Speech: Speech normal.        Behavior: Behavior is cooperative.      No results found for any visits on 04/29/23.  Assessment & Plan    Primary hypertension Assessment & Plan: We see frequent fluctuations in blood pressure as pt has been changing meds. From last visit to today, we have d/c hydrochlorothiazide  2/2 to swelling, dizziness.   Currently managing with losartan  100 mg, amlodipine  5 mg ( lowered from 10 2/2 to peripheral edema), and hydralazine  10 mg, which is prescribed TID, but pt does not do this daily.   Current regimen not achieving target blood pressure. If she takes hydralazine  10 mg TID, we do hit our target.  Advised against switching from losartan  100 to telmisartan . Advised in transition, her BP would elevated, which causes her anxiety, and I do not think the switch would provide us  with any significant change in BP control. I advised patient this switch would still mean she needs to take other BP agents. Advised that losartan  IS controlling her BP-- that without it her BP would be significant higher at home. Tried to explain the MOA of ARBs.   As she wants to get away from TID hydralazine  use, suggested switching the amlodipine  to coreg  BID-- pt hesitant as she has read side effects.   Can tried spironolactone . Pt would like to start with 12.5 mg daily -- advised we would likely need a higher dose-- 12.5 mg for 2-3 days,  with plan to increase to 25 mg daily.  Advised if she is seeing lower pressures, then she would titrate down on hydralazine  dose.   Check bmp today, f/b 4 weeks or earlier if needed   Hypokalemia Assessment & Plan: Repeat bmp  Orders: -     Basic metabolic panel  Morbid obesity (HCC) Assessment & Plan: Referring to pulm for sleep study, r/o OSA as contributing factor to uncontrolled HTN  Currently managed on zepbound  10 mg   Orders: -     Ambulatory referral to Pulmonology  Anemia, unspecified type Assessment & Plan:  Mild, repeat cbc, iron, vit b12  Orders: -     CBC with Differential/Platelet -     IBC + Ferritin -     Vitamin B12     Return in about 4 weeks (around 05/27/2023), or if symptoms worsen or fail to improve, for hypertension.     30 minutes was spent with patient reviewing treatment options, explaining MOA of medications.  Trenton Frock, PA-C  [facility] at [facility] [phone] (phone) [phone] (fax)  [facility]

## 2023-04-29 ENCOUNTER — Encounter: Payer: Self-pay | Admitting: Physician Assistant

## 2023-04-29 ENCOUNTER — Ambulatory Visit: Payer: Medicaid Other | Admitting: Physician Assistant

## 2023-04-29 VITALS — BP 144/94 | HR 95 | Temp 98.5°F | Ht 68.0 in | Wt 299.2 lb

## 2023-04-29 DIAGNOSIS — Z6841 Body Mass Index (BMI) 40.0 and over, adult: Secondary | ICD-10-CM

## 2023-04-29 DIAGNOSIS — I10 Essential (primary) hypertension: Secondary | ICD-10-CM

## 2023-04-29 DIAGNOSIS — D649 Anemia, unspecified: Secondary | ICD-10-CM | POA: Diagnosis not present

## 2023-04-29 DIAGNOSIS — E876 Hypokalemia: Secondary | ICD-10-CM

## 2023-04-29 LAB — CBC WITH DIFFERENTIAL/PLATELET
Basophils Absolute: 0.1 10*3/uL (ref 0.0–0.1)
Basophils Relative: 0.6 % (ref 0.0–3.0)
Eosinophils Absolute: 0 10*3/uL (ref 0.0–0.7)
Eosinophils Relative: 0.2 % (ref 0.0–5.0)
HCT: 37.3 % (ref 36.0–46.0)
Hemoglobin: 11.7 g/dL — ABNORMAL LOW (ref 12.0–15.0)
Lymphocytes Relative: 17.6 % (ref 12.0–46.0)
Lymphs Abs: 2.5 10*3/uL (ref 0.7–4.0)
MCHC: 31.5 g/dL (ref 30.0–36.0)
MCV: 77.6 fL — ABNORMAL LOW (ref 78.0–100.0)
Monocytes Absolute: 0.6 10*3/uL (ref 0.1–1.0)
Monocytes Relative: 4.6 % (ref 3.0–12.0)
Neutro Abs: 10.8 10*3/uL — ABNORMAL HIGH (ref 1.4–7.7)
Neutrophils Relative %: 77 % (ref 43.0–77.0)
Platelets: 230 10*3/uL (ref 150.0–400.0)
RBC: 4.8 Mil/uL (ref 3.87–5.11)
RDW: 16.7 % — ABNORMAL HIGH (ref 11.5–15.5)
WBC: 14 10*3/uL — ABNORMAL HIGH (ref 4.0–10.5)

## 2023-04-29 LAB — IBC + FERRITIN
Ferritin: 11.7 ng/mL (ref 10.0–291.0)
Iron: 31 ug/dL — ABNORMAL LOW (ref 42–145)
Saturation Ratios: 6.2 % — ABNORMAL LOW (ref 20.0–50.0)
TIBC: 498.4 ug/dL — ABNORMAL HIGH (ref 250.0–450.0)
Transferrin: 356 mg/dL (ref 212.0–360.0)

## 2023-04-29 LAB — BASIC METABOLIC PANEL
BUN: 25 mg/dL — ABNORMAL HIGH (ref 6–23)
CO2: 28 meq/L (ref 19–32)
Calcium: 9 mg/dL (ref 8.4–10.5)
Chloride: 103 meq/L (ref 96–112)
Creatinine, Ser: 0.94 mg/dL (ref 0.40–1.20)
GFR: 76.74 mL/min (ref >60.00)
Glucose, Bld: 91 mg/dL (ref 70–99)
Potassium: 3.2 meq/L — ABNORMAL LOW (ref 3.5–5.1)
Sodium: 142 meq/L (ref 135–145)

## 2023-04-29 LAB — VITAMIN B12: Vitamin B-12: 560 pg/mL (ref 211–911)

## 2023-04-29 NOTE — Assessment & Plan Note (Signed)
 Mild, repeat cbc, iron, vit b12

## 2023-04-29 NOTE — Assessment & Plan Note (Addendum)
 Referring to pulm for sleep study, r/o OSA as contributing factor to uncontrolled HTN  Currently managed on zepbound  10 mg

## 2023-04-29 NOTE — Assessment & Plan Note (Signed)
Repeat bmp

## 2023-04-29 NOTE — Assessment & Plan Note (Addendum)
 We see frequent fluctuations in blood pressure as pt has been changing meds. From last visit to today, we have d/c hydrochlorothiazide  2/2 to swelling, dizziness.   Currently managing with losartan  100 mg, amlodipine  5 mg ( lowered from 10 2/2 to peripheral edema), and hydralazine  10 mg, which is prescribed TID, but pt does not do this daily.   Current regimen not achieving target blood pressure. If she takes hydralazine  10 mg TID, we do hit our target.  Advised against switching from losartan  100 to telmisartan . Advised in transition, her BP would elevate, which causes her anxiety, and I do not think the switch would provide us  with any significant change in BP control. I advised patient this switch would still mean she needs to take other BP agents. Advised that losartan  IS controlling her BP-- that without it her BP would be significant higher at home. Tried to explain the MOA of ARBs.   As she wants to get away from TID hydralazine  use, suggested switching the amlodipine  to coreg  BID-- pt hesitant as she has read side effects.   Can tried spironolactone . Pt would like to start with 12.5 mg daily -- advised we would likely need a higher dose-- 12.5 mg for 2-3 days, with plan to increase to 25 mg daily.  Advised if she is seeing lower pressures, then she would titrate down on hydralazine  dose.   Check bmp today, f/b 4 weeks or earlier if needed

## 2023-04-30 ENCOUNTER — Other Ambulatory Visit: Payer: Self-pay | Admitting: Physician Assistant

## 2023-04-30 ENCOUNTER — Encounter: Payer: Self-pay | Admitting: Physician Assistant

## 2023-04-30 DIAGNOSIS — E282 Polycystic ovarian syndrome: Secondary | ICD-10-CM

## 2023-04-30 DIAGNOSIS — D72829 Elevated white blood cell count, unspecified: Secondary | ICD-10-CM

## 2023-04-30 DIAGNOSIS — R7303 Prediabetes: Secondary | ICD-10-CM

## 2023-04-30 DIAGNOSIS — E876 Hypokalemia: Secondary | ICD-10-CM

## 2023-04-30 DIAGNOSIS — I10 Essential (primary) hypertension: Secondary | ICD-10-CM

## 2023-04-30 MED ORDER — FREESTYLE LIBRE 3 PLUS SENSOR MISC: 3 refills | Status: DC

## 2023-04-30 MED ORDER — POTASSIUM CHLORIDE CRYS ER 20 MEQ PO TBCR: 20.0000 meq | EXTENDED_RELEASE_TABLET | Freq: Two times a day (BID) | ORAL | 0 refills | Status: DC

## 2023-05-01 ENCOUNTER — Telehealth: Payer: Self-pay

## 2023-05-01 NOTE — Telephone Encounter (Signed)
Noted

## 2023-05-01 NOTE — Telephone Encounter (Signed)
Continuous glucometers only approved for type 2 diabetes when Pt is on multiple insulin injections daily.   I see that Pt only has prediabetes

## 2023-05-01 NOTE — Telephone Encounter (Signed)
Alfredia Ferguson, PA-C  You27 minutes ago (9:08 AM)    Im aware she wants to pay out of pocket

## 2023-05-02 ENCOUNTER — Other Ambulatory Visit (HOSPITAL_COMMUNITY): Payer: Self-pay

## 2023-05-03 ENCOUNTER — Other Ambulatory Visit: Payer: Self-pay | Admitting: Physician Assistant

## 2023-05-03 ENCOUNTER — Other Ambulatory Visit (HOSPITAL_BASED_OUTPATIENT_CLINIC_OR_DEPARTMENT_OTHER): Payer: Self-pay

## 2023-05-03 DIAGNOSIS — I160 Hypertensive urgency: Secondary | ICD-10-CM

## 2023-05-06 ENCOUNTER — Other Ambulatory Visit (INDEPENDENT_AMBULATORY_CARE_PROVIDER_SITE_OTHER): Payer: Medicaid Other

## 2023-05-06 DIAGNOSIS — D72829 Elevated white blood cell count, unspecified: Secondary | ICD-10-CM | POA: Diagnosis not present

## 2023-05-06 DIAGNOSIS — E876 Hypokalemia: Secondary | ICD-10-CM | POA: Diagnosis not present

## 2023-05-06 LAB — BASIC METABOLIC PANEL
BUN: 21 mg/dL (ref 6–23)
CO2: 26 meq/L (ref 19–32)
Calcium: 8.8 mg/dL (ref 8.4–10.5)
Chloride: 103 meq/L (ref 96–112)
Creatinine, Ser: 0.97 mg/dL (ref 0.40–1.20)
GFR: 73.89 mL/min (ref >60.00)
Glucose, Bld: 98 mg/dL (ref 70–99)
Potassium: 3.5 meq/L (ref 3.5–5.1)
Sodium: 139 meq/L (ref 135–145)

## 2023-05-06 LAB — CBC WITH DIFFERENTIAL/PLATELET
Basophils Absolute: 0.1 10*3/uL (ref 0.0–0.1)
Basophils Relative: 0.5 % (ref 0.0–3.0)
Eosinophils Absolute: 0.1 10*3/uL (ref 0.0–0.7)
Eosinophils Relative: 0.6 % (ref 0.0–5.0)
HCT: 36.1 % (ref 36.0–46.0)
Hemoglobin: 11.3 g/dL — ABNORMAL LOW (ref 12.0–15.0)
Lymphocytes Relative: 18.1 % (ref 12.0–46.0)
Lymphs Abs: 2.5 10*3/uL (ref 0.7–4.0)
MCHC: 31.4 g/dL (ref 30.0–36.0)
MCV: 77.4 fL — ABNORMAL LOW (ref 78.0–100.0)
Monocytes Absolute: 0.7 10*3/uL (ref 0.1–1.0)
Monocytes Relative: 4.8 % (ref 3.0–12.0)
Neutro Abs: 10.5 10*3/uL — ABNORMAL HIGH (ref 1.4–7.7)
Neutrophils Relative %: 76 % (ref 43.0–77.0)
Platelets: 231 10*3/uL (ref 150.0–400.0)
RBC: 4.67 Mil/uL (ref 3.87–5.11)
RDW: 17 % — ABNORMAL HIGH (ref 11.5–15.5)
WBC: 13.8 10*3/uL — ABNORMAL HIGH (ref 4.0–10.5)

## 2023-05-08 ENCOUNTER — Other Ambulatory Visit: Payer: Self-pay | Admitting: Physician Assistant

## 2023-05-08 ENCOUNTER — Encounter: Payer: Self-pay | Admitting: Physician Assistant

## 2023-05-08 ENCOUNTER — Other Ambulatory Visit: Payer: Self-pay

## 2023-05-08 DIAGNOSIS — I10 Essential (primary) hypertension: Secondary | ICD-10-CM

## 2023-05-08 DIAGNOSIS — E876 Hypokalemia: Secondary | ICD-10-CM

## 2023-05-08 MED ORDER — AMLODIPINE BESYLATE 5 MG PO TABS: 5.0000 mg | ORAL_TABLET | Freq: Every day | ORAL | 0 refills | Status: DC

## 2023-05-22 ENCOUNTER — Other Ambulatory Visit: Payer: Self-pay | Admitting: Physician Assistant

## 2023-05-22 DIAGNOSIS — I10 Essential (primary) hypertension: Secondary | ICD-10-CM

## 2023-05-22 MED ORDER — TELMISARTAN 80 MG PO TABS: 80.0000 mg | ORAL_TABLET | Freq: Every day | ORAL | 1 refills | Status: DC

## 2023-05-27 ENCOUNTER — Other Ambulatory Visit (INDEPENDENT_AMBULATORY_CARE_PROVIDER_SITE_OTHER)

## 2023-05-27 DIAGNOSIS — I10 Essential (primary) hypertension: Secondary | ICD-10-CM | POA: Diagnosis not present

## 2023-05-27 DIAGNOSIS — E876 Hypokalemia: Secondary | ICD-10-CM

## 2023-05-28 ENCOUNTER — Telehealth: Payer: Self-pay | Admitting: Pharmacy Technician

## 2023-05-28 ENCOUNTER — Other Ambulatory Visit (HOSPITAL_COMMUNITY): Payer: Self-pay

## 2023-05-28 NOTE — Telephone Encounter (Signed)
 Pharmacy Patient Advocate Encounter   Received notification from CoverMyMeds that prior authorization for TELMISARTAN 80MG  TABLETS is required/requested.   Insurance verification completed.   The patient is insured through  [employer] [place] [employer]  .   Per test claim: [facility]

## 2023-05-29 ENCOUNTER — Other Ambulatory Visit (HOSPITAL_COMMUNITY): Payer: Self-pay

## 2023-05-29 NOTE — Telephone Encounter (Signed)
 Patient notified

## 2023-05-29 NOTE — Telephone Encounter (Signed)
 Pharmacy Patient Advocate Encounter  Received notification from  [facility] [employer]  that Prior Authorization for TELMISARTAN 80MG  has been APPROVED FROM 05/28/23 TO 05/27/24   PA #/Case ID/Reference #: [id number]

## 2023-05-30 ENCOUNTER — Other Ambulatory Visit (HOSPITAL_COMMUNITY): Payer: Self-pay

## 2023-06-04 NOTE — Telephone Encounter (Signed)
 Please see signed telephone encounter from 05/28/23

## 2023-06-06 ENCOUNTER — Ambulatory Visit: Payer: Medicaid Other | Admitting: Obstetrics and Gynecology

## 2023-06-06 ENCOUNTER — Other Ambulatory Visit (HOSPITAL_COMMUNITY)
Admission: RE | Admit: 2023-06-06 | Discharge: 2023-06-06 | Disposition: A | Discharge status: Home / Self Care | Source: Ambulatory Visit | Attending: Obstetrics and Gynecology | Admitting: Obstetrics and Gynecology

## 2023-06-06 ENCOUNTER — Encounter: Payer: Self-pay | Admitting: Obstetrics and Gynecology

## 2023-06-06 ENCOUNTER — Telehealth: Payer: Self-pay | Admitting: *Deleted

## 2023-06-06 VITALS — BP 180/110 | HR 97 | Ht 68.0 in | Wt 301.0 lb

## 2023-06-06 DIAGNOSIS — R229 Localized swelling, mass and lump, unspecified: Secondary | ICD-10-CM

## 2023-06-06 DIAGNOSIS — R102 Pelvic and perineal pain unspecified side: Secondary | ICD-10-CM

## 2023-06-06 DIAGNOSIS — R300 Dysuria: Secondary | ICD-10-CM | POA: Diagnosis not present

## 2023-06-06 DIAGNOSIS — B9689 Other specified bacterial agents as the cause of diseases classified elsewhere: Secondary | ICD-10-CM

## 2023-06-06 DIAGNOSIS — D219 Benign neoplasm of connective and other soft tissue, unspecified: Secondary | ICD-10-CM

## 2023-06-06 DIAGNOSIS — Z01419 Encounter for gynecological examination (general) (routine) without abnormal findings: Secondary | ICD-10-CM | POA: Diagnosis not present

## 2023-06-06 DIAGNOSIS — I10 Essential (primary) hypertension: Secondary | ICD-10-CM

## 2023-06-06 DIAGNOSIS — Z113 Encounter for screening for infections with a predominantly sexual mode of transmission: Secondary | ICD-10-CM | POA: Diagnosis present

## 2023-06-06 DIAGNOSIS — N760 Acute vaginitis: Secondary | ICD-10-CM

## 2023-06-06 NOTE — Progress Notes (Signed)
 GYNECOLOGY OFFICE VISIT NOTE  History:   Tracey Alvarez is a 38 y.o. [phone] here today for establishing care and couple concerns.   She notes she has a history of PCOS (diagnosed by [place], symptoms and previously irregular periods). Her periods are regular since the birth of her son. She still has unwanted hair growth and darkening of skin.   She notes she has lower pelvic pain that started 6 months ago and comes and goes. She denies dyspareunia but sometimes has irritation after intercourse that last 1-2 days after intercourse. They use condoms for pregnancy prevention.   She also has a small bump on the mons that has been present for many years. It mostly does not bother her but more recently feels like a throbbing or discomfort in the area of the bump.     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 Richardson Dopp, MD;  Location: [facility];  Service: Obstetrics;  Laterality: N/A;   CESAREAN SECTION N/A 05/02/2018   Procedure: CESAREAN SECTION;  Surgeon: Hoover Browns, MD;  Location: [facility]IRTHING SU[facility];  Service: Obstetrics;  Laterality: N/A;   TONSILLECTOMY  AGE 59   VERTICAL SLEEVE GASGTRECTOMY  2012    The following portions of the patient's history were reviewed and updated as appropriate: allergies, current medications, past family history, past medical history, past social history, past surgical history and problem list.   Health Maintenance:   Obtain records from [facility]. She believes her last pap was in the last 6 months.   She denies ever having the HPV vaccine.   Review of Systems:   Pertinent items noted in HPI and remainder of comprehensive ROS otherwise negative.  Physical Exam:  BP (!) 180/110   Pulse 97   Ht 5\' 8"  (1.727 m)   Wt (!) 301 lb (136.5 kg)   LMP 05/20/2023   BMI 45.77 kg/m  CONSTITUTIONAL: Well-developed, well-nourished female in no acute distress.  HEENT:  Normocephalic, atraumatic. External right and left ear normal. No scleral icterus.  NECK: Normal range of motion, supple, no masses noted on observation SKIN: No rash noted. Not diaphoretic. No erythema. No pallor. MUSCULOSKELETAL: Normal range of motion. No edema noted. NEUROLOGIC: Alert and oriented to person, place, and time. Normal muscle tone coordination. No cranial nerve deficit noted. PSYCHIATRIC: Normal mood and affect. Normal behavior. Normal judgment and thought content.  PELVIC: Small 1 cm round, mobile nodule about 0.5 cm below the skin on the mons - it is non-tender. Normal appearing external genitalia; normal urethral meatus; normal appearing vaginal mucosa and cervix.  No abnormal discharge noted.  Normal uterine size, no other palpable masses, no uterine or adnexal tenderness. Performed in the presence of a chaperone  Labs and Imaging No results found for this or any previous visit (from the past week). No results found.  Assessment and Plan:  Tracey Alvarez was seen today for establish care.  Diagnoses and all orders for this visit:  Screening examination for STI Monogamous relationship  but patient consents to testing.  -     Cervicovaginal ancillary only( [place])  Dysuria -     Urine Culture  Hypertension, unspecified type Reports taking 3 different blood pressure medications. Report it is lower at home. She has follow up with PCP on 4/17. Encouraged her to keep appointment.   Pelvic pain Check pelvic US for change in symptoms especially with history of fibroid. Will also check full cultures I.e. BV or yeast which may be contributing to discomfort with intercourse as  well.  -     US PELVIC COMPLETE WITH TRANSVAGINAL; Future  Fibroid Will check [place] for baseline  Skin nodule Discussed can be excised if bothersome but also is benign by exam so not required. Reviewed what procedure would entail. Should be possible to remove in the office if continues to bother patient.    Routine preventative health maintenance measures emphasized. Please refer to After Visit Summary for other counseling recommendations.   Return in about 1 year (around 06/05/2024) for annual.  Milas Hock, MD, FACOG Obstetrician & Gynecologist, [facility] for [employer], [facility]

## 2023-06-06 NOTE — Telephone Encounter (Signed)
 Left patient a detailed appointment reminder message.

## 2023-06-07 ENCOUNTER — Other Ambulatory Visit (HOSPITAL_BASED_OUTPATIENT_CLINIC_OR_DEPARTMENT_OTHER): Payer: Self-pay

## 2023-06-10 LAB — CERVICOVAGINAL ANCILLARY ONLY
Bacterial Vaginitis (gardnerella): POSITIVE — AB
Candida Glabrata: NEGATIVE
Candida Vaginitis: NEGATIVE
Chlamydia: NEGATIVE
Comment: NEGATIVE
Comment: NEGATIVE
Comment: NEGATIVE
Comment: NEGATIVE
Comment: NEGATIVE
Comment: NORMAL
Neisseria Gonorrhea: NEGATIVE
Trichomonas: NEGATIVE

## 2023-06-11 ENCOUNTER — Encounter: Payer: Self-pay | Admitting: Obstetrics and Gynecology

## 2023-06-11 DIAGNOSIS — B9689 Other specified bacterial agents as the cause of diseases classified elsewhere: Secondary | ICD-10-CM

## 2023-06-11 MED ORDER — METRONIDAZOLE 500 MG PO TABS: 500.0000 mg | ORAL_TABLET | Freq: Two times a day (BID) | ORAL | 1 refills | Status: AC

## 2023-06-11 NOTE — Addendum Note (Signed)
 Addended by: Milas Hock A on: 06/11/2023 08:28 AM   Modules accepted: Orders

## 2023-06-12 ENCOUNTER — Other Ambulatory Visit (HOSPITAL_COMMUNITY): Payer: Self-pay

## 2023-06-12 ENCOUNTER — Other Ambulatory Visit: Payer: Self-pay | Admitting: Physician Assistant

## 2023-06-12 DIAGNOSIS — I10 Essential (primary) hypertension: Secondary | ICD-10-CM

## 2023-06-12 MED ORDER — AMLODIPINE BESYLATE 10 MG PO TABS: 10.0000 mg | ORAL_TABLET | Freq: Every day | ORAL | 0 refills | Status: DC

## 2023-06-12 MED ORDER — FLUCONAZOLE 150 MG PO TABS: 150.0000 mg | ORAL_TABLET | ORAL | 3 refills | Status: DC

## 2023-06-13 LAB — ALDOSTERONE + RENIN ACTIVITY W/ RATIO
ALDO / PRA Ratio: 44.4 ratio — ABNORMAL HIGH (ref 0.9–28.9)
Aldosterone: 24 ng/dL
Renin Activity: 0.54 ng/mL/h (ref 0.25–5.82)

## 2023-06-17 ENCOUNTER — Encounter: Payer: Self-pay | Admitting: Physician Assistant

## 2023-06-17 ENCOUNTER — Other Ambulatory Visit: Payer: Self-pay | Admitting: Physician Assistant

## 2023-06-17 DIAGNOSIS — E2609 Other primary hyperaldosteronism: Secondary | ICD-10-CM

## 2023-06-17 DIAGNOSIS — I10 Essential (primary) hypertension: Secondary | ICD-10-CM

## 2023-06-17 DIAGNOSIS — E876 Hypokalemia: Secondary | ICD-10-CM

## 2023-06-18 ENCOUNTER — Encounter (INDEPENDENT_AMBULATORY_CARE_PROVIDER_SITE_OTHER): Payer: Self-pay | Admitting: Family Medicine

## 2023-06-25 ENCOUNTER — Ambulatory Visit: Admitting: Physician Assistant

## 2023-07-04 ENCOUNTER — Ambulatory Visit (HOSPITAL_BASED_OUTPATIENT_CLINIC_OR_DEPARTMENT_OTHER): Payer: Medicaid Other | Admitting: Family

## 2023-07-04 ENCOUNTER — Encounter (HOSPITAL_BASED_OUTPATIENT_CLINIC_OR_DEPARTMENT_OTHER): Payer: Self-pay | Admitting: Family

## 2023-07-04 VITALS — BP 138/88 | HR 93 | Ht 68.0 in | Wt 299.0 lb

## 2023-07-04 DIAGNOSIS — I1A0 Resistant hypertension: Secondary | ICD-10-CM | POA: Diagnosis not present

## 2023-07-04 DIAGNOSIS — Z6841 Body Mass Index (BMI) 40.0 and over, adult: Secondary | ICD-10-CM

## 2023-07-04 MED ORDER — SPIRONOLACTONE 25 MG PO TABS: ORAL_TABLET | ORAL | 3 refills | Status: DC

## 2023-07-04 NOTE — Patient Instructions (Signed)
 Medication Instructions:  Your physician has recommended you make the following change in your medication:   Change: Spironolactone 25mg  in the morning and 12.5mg  in the evening    Labwork: Your physician recommends that you return for lab work today- BMP, THYROID PANEL, METANEPHRINES, CATECHOLAMINES  Your physician recommends that you return for lab work ONE WEEK- BMP    Testing/Procedures: Your physician has requested that you have a renal artery duplex. During this test, an ultrasound is used to evaluate blood flow to the kidneys. Allow one hour for this exam. Do not eat after midnight the day before and avoid carbonated beverages. Take your medications as you usually do.    Follow-Up: Please follow up in 2-3  months in [facility] with Dr. Theodis Fiscal, Neomi Banks, NP or Donivan Furry PharmD
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
## 2023-07-13 LAB — BASIC METABOLIC PANEL WITH GFR
BUN/Creatinine Ratio: 23 (ref 9–23)
BUN: 24 mg/dL — ABNORMAL HIGH (ref 6–20)
CO2: 18 mmol/L — ABNORMAL LOW (ref 20–29)
Calcium: 9.5 mg/dL (ref 8.7–10.2)
Chloride: 99 mmol/L (ref 96–106)
Creatinine, Ser: 1.06 mg/dL — ABNORMAL HIGH (ref 0.57–1.00)
Glucose: 86 mg/dL (ref 70–99)
Sodium: 137 mmol/L (ref 134–144)
eGFR: 69 mL/min/{1.73_m2} (ref >59)

## 2023-07-15 ENCOUNTER — Encounter (HOSPITAL_BASED_OUTPATIENT_CLINIC_OR_DEPARTMENT_OTHER): Payer: Self-pay

## 2023-07-15 DIAGNOSIS — I1A0 Resistant hypertension: Secondary | ICD-10-CM

## 2023-07-15 LAB — CATECHOLAMINES, FRACTIONATED, PLASMA
Dopamine: <10 pg/mL (ref 0.0–36.7)
Epinephrine: <10 pg/mL (ref 0.0–55.4)
Norepinephrine: 329 pg/mL (ref 115–524)

## 2023-07-15 LAB — BASIC METABOLIC PANEL WITH GFR
BUN/Creatinine Ratio: 26 — ABNORMAL HIGH (ref 9–23)
BUN: 29 mg/dL — ABNORMAL HIGH (ref 6–20)
CO2: 25 mmol/L (ref 20–29)
Calcium: 9.6 mg/dL (ref 8.7–10.2)
Chloride: 100 mmol/L (ref 96–106)
Creatinine, Ser: 1.13 mg/dL — ABNORMAL HIGH (ref 0.57–1.00)
Glucose: 90 mg/dL (ref 70–99)
Potassium: 3.9 mmol/L (ref 3.5–5.2)
Sodium: 140 mmol/L (ref 134–144)
eGFR: 63 mL/min/{1.73_m2} (ref >59)

## 2023-07-15 LAB — THYROID PANEL WITH TSH
Free Thyroxine Index: 2.2 (ref 1.2–4.9)
T3 Uptake Ratio: 29 % (ref 24–39)
T4, Total: 7.6 ug/dL (ref 4.5–12.0)
TSH: 1.07 u[IU]/mL (ref 0.450–4.500)

## 2023-07-15 LAB — METANEPHRINES, PLASMA
Metanephrine, Free: <25 pg/mL (ref 0.0–88.0)
Normetanephrine, Free: 47.4 pg/mL (ref 0.0–210.1)

## 2023-07-18 ENCOUNTER — Encounter: Payer: Self-pay | Admitting: Physician Assistant

## 2023-07-18 ENCOUNTER — Other Ambulatory Visit: Payer: Self-pay | Admitting: Physician Assistant

## 2023-07-18 MED ORDER — ZEPBOUND 12.5 MG/0.5ML ~~LOC~~ SOAJ: 12.5000 mg | SUBCUTANEOUS | 3 refills | Status: DC

## 2023-07-24 ENCOUNTER — Other Ambulatory Visit (HOSPITAL_COMMUNITY): Payer: Self-pay | Admitting: Physician Assistant

## 2023-07-24 ENCOUNTER — Ambulatory Visit: Admitting: Primary Care

## 2023-07-25 ENCOUNTER — Encounter (HOSPITAL_BASED_OUTPATIENT_CLINIC_OR_DEPARTMENT_OTHER): Payer: Self-pay

## 2023-07-25 DIAGNOSIS — I1A0 Resistant hypertension: Secondary | ICD-10-CM

## 2023-07-25 LAB — BASIC METABOLIC PANEL WITH GFR
BUN/Creatinine Ratio: 31 — ABNORMAL HIGH (ref 9–23)
BUN: 36 mg/dL — ABNORMAL HIGH (ref 6–20)
CO2: 22 mmol/L (ref 20–29)
Calcium: 9.7 mg/dL (ref 8.7–10.2)
Chloride: 101 mmol/L (ref 96–106)
Creatinine, Ser: 1.18 mg/dL — ABNORMAL HIGH (ref 0.57–1.00)
Glucose: 84 mg/dL (ref 70–99)
Potassium: 3.9 mmol/L (ref 3.5–5.2)
Sodium: 138 mmol/L (ref 134–144)
eGFR: 60 mL/min/{1.73_m2} (ref >59)

## 2023-07-25 MED ORDER — SPIRONOLACTONE 25 MG PO TABS: 25.0000 mg | ORAL_TABLET | Freq: Two times a day (BID) | ORAL | 3 refills | Status: DC

## 2023-07-29 ENCOUNTER — Other Ambulatory Visit (HOSPITAL_COMMUNITY): Payer: Self-pay

## 2023-07-29 ENCOUNTER — Telehealth: Payer: Self-pay

## 2023-07-29 NOTE — Telephone Encounter (Signed)
 Pharmacy Patient Advocate Encounter  Received notification from [facility] that Prior Authorization for Wegovy  2.4 has been APPROVED from 07/29/23 to 07/28/24   PA #/Case ID/Reference #: [id number] Your prior authorization for Wegovy  has been approved! More Info Personalized support and financial assistance may be available through the [employer] program. For more information, and to see program requirements, click on the More Info button to the right.  Message from plan: PA Case: [id number], Status: Approved, Coverage Starts on: 07/29/2023 12:00:00 AM, Coverage Ends on: 07/28/2024 12:00:00 AM.. Authorization Expiration Date: Jul 28, 2024.

## 2023-07-29 NOTE — Telephone Encounter (Signed)
 Pharmacy Patient Advocate Encounter   Received notification from Patient Pharmacy that prior authorization for Wegovy  2.4 is required/requested.   Insurance verification completed.   The patient is insured through [facility] .   Per test claim: PA required; PA submitted to above mentioned insurance via CoverMyMeds Key/confirmation #/[id number] Status is pending

## 2023-07-31 ENCOUNTER — Encounter (INDEPENDENT_AMBULATORY_CARE_PROVIDER_SITE_OTHER): Payer: Self-pay | Admitting: Family Medicine

## 2023-07-31 ENCOUNTER — Other Ambulatory Visit (HOSPITAL_BASED_OUTPATIENT_CLINIC_OR_DEPARTMENT_OTHER): Payer: Self-pay

## 2023-07-31 ENCOUNTER — Ambulatory Visit (INDEPENDENT_AMBULATORY_CARE_PROVIDER_SITE_OTHER): Admitting: Family Medicine

## 2023-07-31 VITALS — BP 129/79 | HR 91 | Temp 98.0°F | Ht 68.5 in | Wt 295.0 lb

## 2023-07-31 DIAGNOSIS — I10 Essential (primary) hypertension: Secondary | ICD-10-CM | POA: Diagnosis not present

## 2023-07-31 DIAGNOSIS — E282 Polycystic ovarian syndrome: Secondary | ICD-10-CM

## 2023-07-31 DIAGNOSIS — R7303 Prediabetes: Secondary | ICD-10-CM

## 2023-07-31 DIAGNOSIS — Z6841 Body Mass Index (BMI) 40.0 and over, adult: Secondary | ICD-10-CM

## 2023-07-31 MED ORDER — SPIRONOLACTONE 25 MG PO TABS: 25.0000 mg | ORAL_TABLET | Freq: Two times a day (BID) | ORAL | 3 refills | Status: DC

## 2023-07-31 NOTE — Progress Notes (Signed)
 Rae Bugler, DO, ABFM, ABOM Bariatric physician [address] [place], [place], [place] [address] Office: [phone]  /  Fax: [phone]     Initial Evaluation:  Tracey Alvarez was seen in clinic today to evaluate for obesity. She is interested in losing weight to improve overall health and reduce the risk of weight related complications. She presents today to review program treatment options, initial physical assessment, and evaluation.      She was referred by: PCP  When asked how has your weight affected you? She states: Contributed to medical problems, Having fatigue, and Having poor endurance  Contributing factors to her weight change: Reduced physical activity  Some associated conditions: Hypertension, Hyperlipidemia, Prediabetes, and PCOS  Current nutrition plan: Low-carb; using MyFitnessPal  Current level of physical activity: None  Current or previous pharmacotherapy: GLP-1, Metformin , and GLP-1 + GIP  Response to medication: Ineffective  When asked what else they hope to accomplish, she states: Improve existing medical conditions, reduce number of medications, and improve quality of life   Past Medical History:  Diagnosis Date   Anemia 2012   Depression 2012   POST we loss SURGERY; MEDS X 2 WEEKS, had ppd   Infection 2005   UIT   Infection    YEAST X 1   PCOS (polycystic ovarian syndrome)    Pneumonia    AS TEEN   Pregnancy induced hypertension     Current Outpatient Medications  Medication Instructions   amLODipine  (NORVASC ) 10 mg, Oral, Daily   b complex vitamins capsule 1 capsule, Daily   Continuous Glucose Sensor (FREESTYLE LIBRE 3 PLUS SENSOR) MISC Change sensor every 15 days.   escitalopram  (LEXAPRO ) 10 mg, Oral, Daily   hydrALAZINE  (APRESOLINE ) 10 mg, Oral, 3 times daily   Linzess  145 mcg, Oral, Every morning   MAGNESIUM  PO 1 tablet, Daily   metFORMIN  (GLUCOPHAGE ) 500 mg, Oral, 2 times daily, For more refills patient will need a follow  up appointment.   ondansetron  (ZOFRAN ) 4 mg, Oral, Every 8 hours PRN   spironolactone  (ALDACTONE ) 25 mg, Oral, 2 times daily, TAKE 25MG  ( 1 TABLET) IN THE AM AND 12.5MG  ( HALF TABLET) IN THE PM   telmisartan  (MICARDIS ) 80 mg, Oral, Daily   Zepbound  12.5 mg, Subcutaneous, Weekly     Allergies  Allergen Reactions   Shellfish Allergy Shortness Of Breath   Hydrochlorothiazide  Other (See Comments)    Muscle cramps and urinary frequency affecting qol   Shellfish-Derived Products     Other Reaction(s): respiratory distress     Past Surgical History:  Procedure Laterality Date   CESAREAN SECTION N/A 10/16/2012   Procedure: Primary Cesarean Section Delivery Baby Girl @ 0328, Apgars 8/9;  Surgeon: Lizette Righter. Wynona Hedger, MD;  Location: [facility];  Service: Obstetrics;  Laterality: N/A;   CESAREAN SECTION N/A 05/02/2018   Procedure: CESAREAN SECTION;  Surgeon: Vernal Gold, MD;  Location: [facility] SU[facility];  Service: Obstetrics;  Laterality: N/A;   TONSILLECTOMY  AGE 38   VERTICAL SLEEVE GASGTRECTOMY  2012     Family History  Problem Relation Age of Onset   Anemia Mother    Hypertension Mother    Seizures Brother        CHILDHOOD   Heart attack Maternal Grandmother    Arthritis Maternal Grandmother    Diabetes Maternal Grandmother    Hyperlipidemia Maternal Grandmother    Hypertension Maternal Grandmother    Stroke Maternal Grandmother    Heart disease Maternal Grandfather    Hyperlipidemia Maternal  Grandfather    Stroke Maternal Grandfather    Heart attack Maternal Grandfather    Hypertension Maternal Grandfather    Diabetes Maternal Aunt    Lupus Maternal Aunt    Aneurysm Maternal Uncle    Lupus Cousin      Objective:  BP 129/79   Pulse 91   Temp 98 F (36.7 C)   Ht 5' 8.5" (1.74 m)   Wt 295 lb (133.8 kg)   LMP 07/21/2023   SpO2 97%   BMI 44.20 kg/m  She was weighed on the bioimpedance scale: Body mass index is 44.2 kg/m.  Visceral Fat %:  15%   ,   Body Fat %:   50.7%    Vitals Temp: 98 F (36.7 C) BP: 129/79 Pulse Rate: 91 SpO2: 97 %   Anthropometric Measurements Height: 5' 8.5" (1.74 m) Weight: 295 lb (133.8 kg) BMI (Calculated): 44.2 Weight at Last Visit: NA Weight Lost Since Last Visit: NA Weight Gained Since Last Visit: NA Starting Weight: NA Total Weight Loss (lbs):  (0 kg) (NA) Peak Weight: 318lb   Body Composition  Body Fat %: 50.7 % Fat Mass (lbs): 149.6 lbs Muscle Mass (lbs): 138 lbs Total Body Water (lbs): 106.6 lbs Visceral Fat Rating : 15   No data recorded   General: Well Developed, well nourished, and in no acute distress.  HEENT: Normocephalic, atraumatic; EOMI, sclerae are anicteric. Skin: Warm and dry, good turgor Chest:  Normal excursion, shape, no gross ABN Respiratory: No conversational dyspnea; speaking in full sentences NeuroM-Sk:  Normal gross ROM * 4 extremities  Psych: A and O *3, insight adequate, mood- full    Assessment and Plan:   FOR THE DISEASE OF OBESITY: BMI 40.0-44.9, adult (HCC) -- Current BMI 44.2 Morbid obesity (HCC) Assessment & Plan: We reviewed anthropometrics, biometrics, associated medical conditions and contributing factors with patient. Tracey Alvarez would benefit from a medically tailored reduced calorie nutrional plan based on his REE (resting energy expenditure), which will be determined by indirect calorimetry.  We will also assess for cardiometabolic risk and nutritional derangements via fasting labs at intake appointment.   Started Wegovy  on 12/05/22. Pt was given option to increase Wegovy  above 1.7 mg, but pt was instead interested in change to Zepbound  on 03/22/23. Plateau with Wegovy  at 12 lbs and that is why they made this change. Since January she has not lost any weight on the increasing dose of Zepbound .   Obesity Treatment / Action Plan:   she was weighed on the bioimpedance scale and results were discussed and documented in the synopsis.   Tracey Alvarez will complete  provided nutritional and psychosocial assessment questionnaire before the next appointment.  she will be scheduled for indirect calorimetry to determine resting energy expenditure in a fasting state.  This will allow us  to create a reduced calorie, high-protein meal plan to promote loss of fat mass while preserving muscle mass.  We will also assess for cardiometabolic risk and nutritional derangements via an ECG and fasting serologies at her next appointment.  she was encouraged to work on amassing support from family and friends to begin their weight loss journey.   Work on eliminating or reducing the presence of highly processed, poorly nutritious, calorie-dense foods in the home.   Obesity Education Performed Today:  Patient was counseled on nutritional approaches to weight loss and benefits of reducing processed foods and consuming plant-based foods and high quality protein as part of nutritional weight management program.   We  discussed the importance of long term lifestyle changes which include nutrition, exercise and behavioral modifications as well as the importance of customizing this to her specific health and social needs.   We discussed the benefits of reaching a healthier weight to alleviate the symptoms of existing conditions and reduce the risks of the biomechanical, metabolic and psychological effects of obesity.  Was counseled on the health benefits of losing 5%-10% of total body weight.  Was counseled on our cognitive behavorial therapy program, lead by our bariatric psychologist, who focuses on emotional eating and creating positive behavorial change.  Was counseled on bariatric pharmacotherapy and how this may be used as an adjunct in their weight management    Tracey Alvarez appears to be in the action stage of change and states they are ready to start intensive lifestyle modifications and behavioral modifications.  It was recommended that she follow up in the next 1-2  weeks to review the above steps, and to continue with treatment of their chronic disease state of obesity   FOR OTHER CONDITIONS RELATED TO THE DISEASE OF OBESITY: Primary hypertension Assessment & Plan: BP Readings from Last 3 Encounters:  07/31/23 129/79  07/04/23 138/88  06/06/23 (!) 180/110   Pt it on Mycardis 80 mg once daily, Amlodipine  10 mg once daily, Spironolactone  25 mg in the AM and 12.5 mg  in the PM. Pt reports a h/o pregnancy induced HTN in 2019. Continue with current antihypertensive regimen as instructed by PCP. Reviewed benefits of healthy eating habits, exercise, and wt loss in improving overall condition. Will continue monitoring condition alongside PCP if pt desires to enroll in wt loss program.    PCOS (polycystic ovarian syndrome) Assessment & Plan: Lab Results  Component Value Date   HGBA1C 6.1 02/06/2023   HGBA1C 5.6 11/17/2017    Pt monitors her blood glucose levels on her freestyle libre and reports receiving low blood glucose readings. As a result, she mutually decided with her PCP to discontinue Metformin  around January/February. She additionally experienced increased fatigue with Metformin . Continue current med regimen as advised by PCP and follow up as instructed by them. Reviewed potential benefits of wt loss in improving overall condition.    Prediabetes Assessment & Plan: Lab Results  Component Value Date   HGBA1C 6.1 02/06/2023   HGBA1C 5.6 11/17/2017    Pt is on Zepbound  12.5 mg once weekly. Tolerating well with no adverse SE reported. Had h/o gestational first child born in 2014. Educated pt that a history of gestational diabetes increases her risk of progressing from prediabetes to type 2 diabetes. Continue with current med regimen as prescribed and instructed by PCP. Continue following up with PCP for management of condition.    Attestations:   Tracey Alvarez, acting as a medical scribe for Marceil Sensor, DO., have compiled all relevant  documentation for today's office visit on behalf of Marceil Sensor, DO, while in the presence of [employer], DO.  Reviewed by clinician on day of visit: allergies, medications, problem list, medical history, surgical history, family history, social history, and previous encounter notes pertinent to obesity diagnosis.    I have reviewed the above documentation for accuracy and completeness, and I agree with the above. Rae Bugler, D.O.  The 21st Century Cures Act was signed into law in 2016 which includes the topic of electronic health records.  This provides immediate access to information in MyChart.  This includes consultation notes, operative notes, office notes, lab results and pathology reports.  If  you have any questions about what you read please let us  know at your next visit so we can discuss your concerns and take corrective action if need be.  We are right here with you!

## 2023-08-05 ENCOUNTER — Encounter (HOSPITAL_BASED_OUTPATIENT_CLINIC_OR_DEPARTMENT_OTHER)

## 2023-08-05 ENCOUNTER — Ambulatory Visit (HOSPITAL_BASED_OUTPATIENT_CLINIC_OR_DEPARTMENT_OTHER)

## 2023-08-05 DIAGNOSIS — I10 Essential (primary) hypertension: Secondary | ICD-10-CM | POA: Diagnosis not present

## 2023-08-05 DIAGNOSIS — I1A0 Resistant hypertension: Secondary | ICD-10-CM

## 2023-08-09 ENCOUNTER — Ambulatory Visit (HOSPITAL_BASED_OUTPATIENT_CLINIC_OR_DEPARTMENT_OTHER): Payer: Self-pay | Admitting: Family

## 2023-08-09 NOTE — Telephone Encounter (Signed)
 Please review and advise.

## 2023-08-10 ENCOUNTER — Other Ambulatory Visit: Payer: Self-pay | Admitting: Physician Assistant

## 2023-08-13 ENCOUNTER — Other Ambulatory Visit (HOSPITAL_BASED_OUTPATIENT_CLINIC_OR_DEPARTMENT_OTHER): Payer: Self-pay

## 2023-08-13 MED ORDER — METFORMIN HCL 500 MG PO TABS
500.0000 mg | ORAL_TABLET | Freq: Two times a day (BID) | ORAL | 0 refills | Status: DC
  Filled 2023-08-13: qty 60, 30d supply, fill #0

## 2023-08-20 ENCOUNTER — Encounter: Payer: Self-pay | Admitting: Endocrinology

## 2023-08-20 ENCOUNTER — Ambulatory Visit: Admitting: Endocrinology

## 2023-08-20 VITALS — BP 110/80 | HR 69 | Resp 20 | Ht 68.5 in | Wt 295.8 lb

## 2023-08-20 DIAGNOSIS — E876 Hypokalemia: Secondary | ICD-10-CM

## 2023-08-20 DIAGNOSIS — I10 Essential (primary) hypertension: Secondary | ICD-10-CM
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
## 2023-08-20 NOTE — Patient Instructions (Signed)
 The blood pressure medicines you have been taking now spironolactone , telmisartan  and amlodipine  can alter blood test results for aldosterone and renin to test for hyperaldosteronism.  Blood test results you recently had with [facility] are reassuring that aldosterone, renin levels are within the normal range.  However the blood pressure medications you have been taking may have alter the test results.  To workup for hyperaldosteronism in the correct way we need to hold your current blood pressure medications telmisartan , amlodipine  and spironolactone .  In that case you will be requiring different blood pressure medication that does not alter the test results.  Recheck the lab in 6 to 8 weeks.  When you are ready let us  know or call our clinic we can plan to hold your current blood pressure medications, send new different blood pressure medicines and plan for retest.

## 2023-08-23 ENCOUNTER — Other Ambulatory Visit (HOSPITAL_BASED_OUTPATIENT_CLINIC_OR_DEPARTMENT_OTHER): Payer: Self-pay

## 2023-08-25 ENCOUNTER — Emergency Department (HOSPITAL_BASED_OUTPATIENT_CLINIC_OR_DEPARTMENT_OTHER)
Admission: EM | Admit: 2023-08-25 | Discharge: 2023-08-25 | Disposition: A | Discharge status: Home / Self Care | Attending: Emergency Medicine | Admitting: Emergency Medicine

## 2023-08-25 ENCOUNTER — Encounter (HOSPITAL_BASED_OUTPATIENT_CLINIC_OR_DEPARTMENT_OTHER): Payer: Self-pay | Admitting: Urology

## 2023-08-25 DIAGNOSIS — D72829 Elevated white blood cell count, unspecified: Secondary | ICD-10-CM | POA: Diagnosis not present

## 2023-08-25 DIAGNOSIS — N182 Chronic kidney disease, stage 2 (mild): Secondary | ICD-10-CM | POA: Diagnosis not present

## 2023-08-25 DIAGNOSIS — H5789 Other specified disorders of eye and adnexa: Secondary | ICD-10-CM | POA: Insufficient documentation

## 2023-08-25 DIAGNOSIS — N898 Other specified noninflammatory disorders of vagina: Secondary | ICD-10-CM | POA: Diagnosis present

## 2023-08-25 DIAGNOSIS — D631 Anemia in chronic kidney disease: Secondary | ICD-10-CM | POA: Insufficient documentation

## 2023-08-25 DIAGNOSIS — R519 Headache, unspecified: Secondary | ICD-10-CM | POA: Insufficient documentation

## 2023-08-25 DIAGNOSIS — B3731 Acute candidiasis of vulva and vagina: Secondary | ICD-10-CM | POA: Diagnosis not present

## 2023-08-25 DIAGNOSIS — N760 Acute vaginitis: Secondary | ICD-10-CM | POA: Insufficient documentation

## 2023-08-25 DIAGNOSIS — B9689 Other specified bacterial agents as the cause of diseases classified elsewhere: Secondary | ICD-10-CM | POA: Diagnosis not present

## 2023-08-25 DIAGNOSIS — I129 Hypertensive chronic kidney disease with stage 1 through stage 4 chronic kidney disease, or unspecified chronic kidney disease: Secondary | ICD-10-CM | POA: Insufficient documentation

## 2023-08-25 HISTORY — DX: Chronic kidney disease, stage 2 (mild): N18.2

## 2023-08-25 LAB — COMPREHENSIVE METABOLIC PANEL WITH GFR
ALT: 11 U/L (ref 0–44)
AST: 14 U/L — ABNORMAL LOW (ref 15–41)
Albumin: 4.6 g/dL (ref 3.5–5.0)
Alkaline Phosphatase: 47 U/L (ref 38–126)
Anion gap: 13 (ref 5–15)
BUN: 27 mg/dL — ABNORMAL HIGH (ref 6–20)
CO2: 25 mmol/L (ref 22–32)
Calcium: 9.2 mg/dL (ref 8.9–10.3)
Chloride: 100 mmol/L (ref 98–111)
Creatinine, Ser: 1.34 mg/dL — ABNORMAL HIGH (ref 0.44–1.00)
GFR, Estimated: 51 mL/min — ABNORMAL LOW (ref >60)
Glucose, Bld: 100 mg/dL — ABNORMAL HIGH (ref 70–99)
Potassium: 4.1 mmol/L (ref 3.5–5.1)
Sodium: 137 mmol/L (ref 135–145)
Total Bilirubin: 0.4 mg/dL (ref 0.0–1.2)
Total Protein: 7.9 g/dL (ref 6.5–8.1)

## 2023-08-25 LAB — CBC WITH DIFFERENTIAL/PLATELET
Abs Immature Granulocytes: 0.08 10*3/uL — ABNORMAL HIGH (ref 0.00–0.07)
Basophils Absolute: 0 10*3/uL (ref 0.0–0.1)
Basophils Relative: 0 %
Eosinophils Absolute: 0 10*3/uL (ref 0.0–0.5)
Eosinophils Relative: 0 %
HCT: 38.2 % (ref 36.0–46.0)
Hemoglobin: 12.1 g/dL (ref 12.0–15.0)
Immature Granulocytes: 1 %
Lymphocytes Relative: 17 %
Lymphs Abs: 2.6 10*3/uL (ref 0.7–4.0)
MCH: 24.9 pg — ABNORMAL LOW (ref 26.0–34.0)
MCHC: 31.7 g/dL (ref 30.0–36.0)
MCV: 78.6 fL — ABNORMAL LOW (ref 80.0–100.0)
Monocytes Absolute: 0.8 10*3/uL (ref 0.1–1.0)
Monocytes Relative: 5 %
Neutro Abs: 11.8 10*3/uL — ABNORMAL HIGH (ref 1.7–7.7)
Neutrophils Relative %: 77 %
Platelets: 239 10*3/uL (ref 150–400)
RBC: 4.86 MIL/uL (ref 3.87–5.11)
RDW: 17.7 % — ABNORMAL HIGH (ref 11.5–15.5)
WBC: 15.3 10*3/uL — ABNORMAL HIGH (ref 4.0–10.5)
nRBC: 0 % (ref 0.0–0.2)

## 2023-08-25 LAB — URINALYSIS, ROUTINE W REFLEX MICROSCOPIC
Bilirubin Urine: NEGATIVE
Glucose, UA: NEGATIVE mg/dL
Hgb urine dipstick: NEGATIVE
Ketones, ur: NEGATIVE mg/dL
Leukocytes,Ua: NEGATIVE
Nitrite: NEGATIVE
Protein, ur: NEGATIVE mg/dL
Specific Gravity, Urine: 1.01 (ref 1.005–1.030)
pH: 5.5 (ref 5.0–8.0)

## 2023-08-25 LAB — WET PREP, GENITAL
Sperm: NONE SEEN
Trich, Wet Prep: NONE SEEN
WBC, Wet Prep HPF POC: >=10 — AB (ref <10)

## 2023-08-25 LAB — PREGNANCY, URINE: Preg Test, Ur: NEGATIVE

## 2023-08-25 MED ORDER — METRONIDAZOLE 500 MG PO TABS: 500.0000 mg | ORAL_TABLET | Freq: Two times a day (BID) | ORAL | 0 refills | Status: DC

## 2023-08-25 MED ORDER — FLUCONAZOLE 150 MG PO TABS
150.0000 mg | ORAL_TABLET | Freq: Every day | ORAL | 0 refills | Status: AC
Start: 2023-08-25 — End: 2023-09-01

## 2023-08-25 MED ORDER — FLUCONAZOLE 150 MG PO TABS: 150.0000 mg | ORAL_TABLET | Freq: Once | ORAL | Status: DC

## 2023-08-25 NOTE — ED Notes (Signed)
 Discharge instructions reviewed with patient. Patient verbalizes understanding, no further questions at this time. Medications/prescriptions and follow up information provided. No acute distress noted at time of departure.

## 2023-08-25 NOTE — Discharge Instructions (Signed)
 You are seen today for about bacterial vaginosis and yeast infection.  I am prescribing a singular fluconazole  pill to use in the presence of symptoms not going away for the vaginal irritation.  I am also prescribing metronidazole , take this over the next 7 days, be sure to not take this with alcohol.  Please be sure to follow-up with PCP for a lab redraw as your white count was mildly elevated today from your normal, and would like to have that reassessed in 1 week. Please return to the ED if you is having new or worsening symptoms include fever, worsening pain, chest pain, shortness of breath, abdominal pain, diarrhea, dysuria, lower leg swelling.

## 2023-08-25 NOTE — ED Provider Notes (Addendum)
 [facility] Provider Note   CSN: [id number] Arrival date & time: 08/25/23  1340     History  Chief Complaint  Patient presents with   Eye Twitching    Tracey Alvarez is a 39 y.o. female.  HPI Patient is a 38 year old female presents the ED today with concerns for eye twitching and muscle spasms for the last 2 days, notes that she has not been drinking as much water as normal and was concern for metabolite disturbance.  Also reports that she has had some mild "vaginal irritation" after her husband recently changed condom types.  LMP was 5/4, notably 12 days late. Note that she has had a mild headache, saying that she has not slept well the last few nights and has been under more stress recently.  Recently saw endocrinologist who she believed did not look at her chart well enough.  And is currently not wanting to switch from her current HTN regimen.  Denies fever, cough, tension, chest pain, shortness of breath, abdominal pain, nausea, vomiting, diarrhea, dysuria, vaginal discharge, vaginal bleeding, lower leg swelling.    Home Medications Prior to Admission medications   Medication Sig Start Date End Date Taking? Authorizing Provider  fluconazole  (DIFLUCAN ) 150 MG tablet Take 1 tablet (150 mg total) by mouth daily for 7 days. 08/25/23 09/01/23 Yes Hayes Lipps, PA-C  metroNIDAZOLE  (FLAGYL ) 500 MG tablet Take 1 tablet (500 mg total) by mouth 2 (two) times daily. 08/25/23  Yes Hayes Lipps, PA-C  amLODipine  (NORVASC ) 10 MG tablet Take 1 tablet (10 mg total) by mouth daily. 06/12/23   Trenton Frock, PA-C  b complex vitamins capsule Take 1 capsule by mouth daily.    [provider]  co-enzyme Q-10 30 MG capsule Take 30 mg by mouth daily.    [provider]  Continuous Glucose Sensor (FREESTYLE LIBRE 3 PLUS SENSOR) MISC Change sensor every 15 days. 04/30/23   Trenton Frock, PA-C  escitalopram  (LEXAPRO ) 10 MG tablet  Take 1 tablet (10 mg total) by mouth daily. Patient not taking: Reported on 08/20/2023 02/06/23   Trenton Frock, PA-C  hydrALAZINE  (APRESOLINE ) 10 MG tablet Take 1 tablet (10 mg total) by mouth 3 (three) times daily. Patient taking differently: Take 10 mg by mouth 3 (three) times daily. Patient takes as needed 04/23/23   Drubel, Heidi Llamas, PA-C  linaclotide  (LINZESS ) 145 MCG CAPS capsule Take 1 capsule (145 mcg total) by mouth in the morning. Patient taking differently: Take 145 mcg by mouth daily as needed. 01/01/23     MAGNESIUM  PO Take 1 tablet by mouth daily.    [provider]  metFORMIN  (GLUCOPHAGE ) 500 MG tablet Take 1 tablet (500 mg total) by mouth 2 (two) times daily. Patient not taking: Reported on 08/20/2023 08/13/23   Trenton Frock, PA-C  omega-3 acid ethyl esters (LOVAZA) 1 g capsule Take 1 g by mouth daily.    [provider]  ondansetron  (ZOFRAN ) 4 MG tablet Take 1 tablet (4 mg total) by mouth every 8 (eight) hours as needed for nausea or vomiting. Patient not taking: Reported on 08/20/2023 04/08/23   Trenton Frock, PA-C  spironolactone  (ALDACTONE ) 25 MG tablet Take 1 tablet (25 mg total) by mouth 2 (two) times daily. 07/31/23   Clearnce Curia, NP  telmisartan  (MICARDIS ) 80 MG tablet Take 1 tablet (80 mg total) by mouth daily. 05/22/23   Trenton Frock, PA-C  tirzepatide  (ZEPBOUND ) 12.5 MG/0.5ML Pen Inject 12.5 mg into the  skin once a week. 07/18/23   Trenton Frock, PA-C      Allergies    Shellfish allergy, Hydrochlorothiazide , and Shellfish-derived products    Review of Systems   Review of Systems  Musculoskeletal:  Positive for myalgias.  Neurological:  Positive for headaches.  All other systems reviewed and are negative.   Physical Exam Updated Vital Signs BP (!) 153/97 (BP Location: Left Arm)   Pulse 85   Temp (!) 97.2 F (36.2 C)   Resp 18   Ht 5\' 8"  (1.727 m)   Wt 134.1 kg   LMP 07/21/2023   SpO2 98%   BMI 44.95 kg/m  Physical Exam Vitals and  nursing note reviewed.  Constitutional:      General: She is not in acute distress.    Appearance: Normal appearance. She is obese. She is not ill-appearing or diaphoretic.  HENT:     Head: Normocephalic and atraumatic.     Right Ear: Tympanic membrane normal.     Nose: No congestion.     Mouth/Throat:     Mouth: Mucous membranes are moist.     Pharynx: Oropharynx is clear. No oropharyngeal exudate or posterior oropharyngeal erythema.  Eyes:     General: No scleral icterus.       Right eye: No discharge.        Left eye: No discharge.     Extraocular Movements: Extraocular movements intact.     Conjunctiva/sclera: Conjunctivae normal.     Pupils: Pupils are equal, round, and reactive to light.  Cardiovascular:     Rate and Rhythm: Normal rate and regular rhythm.     Pulses: Normal pulses.     Heart sounds: Normal heart sounds. No murmur heard.    No friction rub. No gallop.  Pulmonary:     Effort: Pulmonary effort is normal. No respiratory distress.     Breath sounds: Normal breath sounds. No stridor. No wheezing, rhonchi or rales.  Chest:     Chest wall: No tenderness.  Abdominal:     General: Abdomen is flat. There is no distension.     Palpations: Abdomen is soft.     Tenderness: There is no abdominal tenderness. There is no right CVA tenderness, left CVA tenderness, guarding or rebound.  Musculoskeletal:     Cervical back: Normal range of motion and neck supple. No rigidity or tenderness.     Right lower leg: No edema.     Left lower leg: No edema.  Skin:    General: Skin is warm and dry.     Findings: No bruising, erythema, lesion or rash.  Neurological:     General: No focal deficit present.     Mental Status: She is alert and oriented to person, place, and time. Mental status is at baseline.     Sensory: No sensory deficit.     Motor: No weakness.     Gait: Gait normal.  Psychiatric:        Mood and Affect: Mood normal.     ED Results / Procedures / Treatments    Labs (all labs ordered are listed, but only abnormal results are displayed) Labs Reviewed  WET PREP, GENITAL - Abnormal; Notable for the following components:      Result Value   Yeast Wet Prep HPF POC PRESENT (*)    Clue Cells Wet Prep HPF POC PRESENT (*)    WBC, Wet Prep HPF POC >=10 (*)    All other components within normal limits  CBC WITH DIFFERENTIAL/PLATELET - Abnormal; Notable for the following components:   WBC 15.3 (*)    MCV 78.6 (*)    MCH 24.9 (*)    RDW 17.7 (*)    Neutro Abs 11.8 (*)    Abs Immature Granulocytes 0.08 (*)    All other components within normal limits  COMPREHENSIVE METABOLIC PANEL WITH GFR - Abnormal; Notable for the following components:   Glucose, Bld 100 (*)    BUN 27 (*)    Creatinine, Ser 1.34 (*)    AST 14 (*)    GFR, Estimated 51 (*)    All other components within normal limits  URINALYSIS, ROUTINE W REFLEX MICROSCOPIC  PREGNANCY, URINE  GC/CHLAMYDIA PROBE AMP ([place]) NOT AT [facility]    EKG None  Radiology No results found.  Procedures Procedures    Medications Ordered in ED Medications  fluconazole  (DIFLUCAN ) tablet 150 mg (has no administration in time range)    ED Course/ Medical Decision Making/ A&P                                 Medical Decision Making Amount and/or Complexity of Data Reviewed Labs: ordered.  Risk Prescription drug management.   This patient is a 38 year old female  who presents to the ED for concern of muscle spasms, headache, vaginal irritation.  Has not taking any medications at this time to relieve.  On physical exam, patient is in no acute distress, afebrile, alert and orient x 4, speaking in full sentences, nontachypneic, nontachycardic.  PERRL, EOMs intact, oropharynx clear, no cervical lymphadenopathy, LCTAB, no murmurs, no abdominal pain to ovation, no CVA tenderness, unremarkable physical exam.  Defers GU exam.    Differential diagnoses prior to evaluation: The emergent  differential diagnosis includes, but is not limited to, pregnancy, metabolite disturbance, UTI, STI, migraine, tension headache, cluster headache, sleep deprivation, dehydration. This is not an exhaustive differential.   Past Medical History / Co-morbidities / Social History: PCOS, CKD stage II, anemia, depression,  Additional history: Chart reviewed. 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  Noted to have also been evaluated by nephrology due to an elevated creatinine.  Noted to have HTN controlled on 3 hypertensive medications.  Taking telmisartan , amlodipine , spironolactone .  Notes that could not he have been worked up for aldosteronism due to not wanting to withhold spironolactone  for testing purposes.  Lab Tests/Imaging studies: I personally interpreted labs/imaging and the pertinent results include:    CBC notes an elevated leukocytosis with 15.3, notably chronically elevated.With last white count notably hovering around 14  CMP does note a mildly elevated creatinine and decreased GFR from previous, likely due to secondary to dehydration  UA unremarkable Wet prep does show clue cells and yeast, pregnancy is negative  GC pending, will treat if positive  Medications: I ordered medication including fluconazole .  I have reviewed the patients home medicines and have made adjustments as needed.  Critical Interventions: None  Social Determinants of Health:  Disposition: After consideration of the diagnostic results and the patients response to treatment, I feel that the patient would benefit from discharge and treatment as above.   emergency department workup does not suggest an emergent condition requiring admission or immediate intervention beyond what has been performed at this time. The plan is: Follow-up with PCP in 1 week for lab draw to reevaluate  leukocytosis, fluconazole  for  yeast infection, metronidazole  for BV, return for new or worsening symptoms. The patient is safe for discharge and has been instructed to return immediately for worsening symptoms, change in symptoms or any other concerns.    Final Clinical Impression(s) / ED Diagnoses Final diagnoses:  BV (bacterial vaginosis)  Vaginal candida    Rx / DC Orders ED Discharge Orders          Ordered    fluconazole  (DIFLUCAN ) 150 MG tablet  Daily        08/25/23 1620    metroNIDAZOLE  (FLAGYL ) 500 MG tablet  2 times daily        08/25/23 1620              Hayes Lipps, PA-C 08/25/23 1622    Hayes Lipps, PA-C 08/25/23 1624    Dalene Duck, MD 08/25/23 2253

## 2023-08-25 NOTE — ED Triage Notes (Signed)
 Pt states twitch to underneath right eye that started Friday  States making her face feel tense now  Does not wear contacts or glasses   Also states muscle cramping intermittent  dx of hydro aldosteronism, concern for electrolyte imbalance

## 2023-08-25 NOTE — ED Notes (Signed)
 Order placed for diflucan  after patient was discharged. Prescription was provided.

## 2023-08-26 LAB — GC/CHLAMYDIA PROBE AMP (~~LOC~~) NOT AT ARMC
Chlamydia: NEGATIVE
Comment: NEGATIVE
Comment: NORMAL
Neisseria Gonorrhea: NEGATIVE

## 2023-08-28 ENCOUNTER — Encounter: Payer: Self-pay | Admitting: Physician Assistant

## 2023-08-29 ENCOUNTER — Encounter (HOSPITAL_COMMUNITY)

## 2023-08-29 ENCOUNTER — Other Ambulatory Visit: Payer: Self-pay | Admitting: Nephrology

## 2023-08-29 DIAGNOSIS — E269 Hyperaldosteronism, unspecified: Secondary | ICD-10-CM

## 2023-09-02 ENCOUNTER — Ambulatory Visit: Admitting: Physician Assistant

## 2023-09-05 ENCOUNTER — Encounter: Payer: Self-pay | Admitting: Nephrology

## 2023-09-06 ENCOUNTER — Inpatient Hospital Stay: Admission: RE | Admit: 2023-09-06 | Source: Ambulatory Visit

## 2023-09-07 ENCOUNTER — Other Ambulatory Visit: Payer: Self-pay | Admitting: Physician Assistant

## 2023-09-07 DIAGNOSIS — I10 Essential (primary) hypertension: Secondary | ICD-10-CM

## 2023-09-09 ENCOUNTER — Telehealth: Payer: Self-pay

## 2023-09-09 ENCOUNTER — Other Ambulatory Visit (HOSPITAL_COMMUNITY): Payer: Self-pay

## 2023-09-09 NOTE — Telephone Encounter (Signed)
 Pharmacy Patient Advocate Encounter   Received notification from CoverMyMeds that prior authorization for Zepbound  5 is required/requested.   Insurance verification completed.   The patient is insured through [employer] .   Per test claim: The current 28 day co-pay is, $4.00.  No PA needed at this time. This test claim was processed through [facility]- copay amounts may vary at other pharmacies due to pharmacy/plan contracts, or as the patient moves through the different stages of their insurance plan.

## 2023-09-10 ENCOUNTER — Ambulatory Visit (HOSPITAL_BASED_OUTPATIENT_CLINIC_OR_DEPARTMENT_OTHER): Admitting: Cardiovascular Disease

## 2023-09-10 ENCOUNTER — Encounter (HOSPITAL_BASED_OUTPATIENT_CLINIC_OR_DEPARTMENT_OTHER): Payer: Self-pay | Admitting: Cardiovascular Disease

## 2023-09-10 VITALS — BP 137/83 | HR 93 | Resp 12 | Ht 68.0 in | Wt 295.8 lb

## 2023-09-10 DIAGNOSIS — I1A0 Resistant hypertension: Secondary | ICD-10-CM

## 2023-09-10 MED ORDER — SPIRONOLACTONE 25 MG PO TABS: 37.5000 mg | ORAL_TABLET | Freq: Two times a day (BID) | ORAL | 3 refills | Status: DC
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
## 2023-09-10 NOTE — Patient Instructions (Signed)
 Medication Instructions:  Your physician has recommended you make the following change in your medication:   INCREASE Spironolactone  to 37.5 mg   Labwork: Your physician recommends that you return for lab work in 1 week: BMET and MAGNESIUM    Follow-Up: Please follow up in 2 months in [facility] with Dr. Raford, Reche Finder, NP or Allean Mink PharmD    Special Instructions:  Reach out to our office when CT scan is done.

## 2023-09-12 ENCOUNTER — Ambulatory Visit: Admitting: Physician Assistant

## 2023-09-12 ENCOUNTER — Encounter (HOSPITAL_BASED_OUTPATIENT_CLINIC_OR_DEPARTMENT_OTHER): Payer: Self-pay | Admitting: Cardiovascular Disease

## 2023-09-16 ENCOUNTER — Other Ambulatory Visit: Payer: Self-pay | Admitting: Nephrology

## 2023-09-16 ENCOUNTER — Inpatient Hospital Stay
Admission: RE | Admit: 2023-09-16 | Discharge: 2023-09-16 | Disposition: A | Discharge status: Home / Self Care | Source: Ambulatory Visit | Attending: Nephrology | Admitting: Nephrology

## 2023-09-16 DIAGNOSIS — E269 Hyperaldosteronism, unspecified: Secondary | ICD-10-CM

## 2023-09-18 ENCOUNTER — Encounter (HOSPITAL_BASED_OUTPATIENT_CLINIC_OR_DEPARTMENT_OTHER): Payer: Self-pay | Admitting: Cardiovascular Disease

## 2023-09-18 DIAGNOSIS — E7849 Other hyperlipidemia: Secondary | ICD-10-CM

## 2023-09-18 NOTE — Telephone Encounter (Signed)
 Pt requesting you to review recent Ct scan

## 2023-09-25 LAB — LIPID PANEL
Chol/HDL Ratio: 6.1 ratio — ABNORMAL HIGH (ref 0.0–4.4)
Cholesterol, Total: 183 mg/dL (ref 100–199)
HDL: 30 mg/dL — ABNORMAL LOW (ref >39)
LDL Chol Calc (NIH): 116 mg/dL — ABNORMAL HIGH (ref 0–99)
Triglycerides: 211 mg/dL — ABNORMAL HIGH (ref 0–149)
VLDL Cholesterol Cal: 37 mg/dL (ref 5–40)

## 2023-09-25 LAB — COMPREHENSIVE METABOLIC PANEL WITH GFR
ALT: 11 IU/L (ref 0–32)
AST: 11 IU/L (ref 0–40)
Albumin: 4.2 g/dL (ref 3.9–4.9)
Alkaline Phosphatase: 49 IU/L (ref 44–121)
BUN/Creatinine Ratio: 23 (ref 9–23)
BUN: 25 mg/dL — ABNORMAL HIGH (ref 6–20)
Bilirubin Total: 0.2 mg/dL (ref 0.0–1.2)
CO2: 22 mmol/L (ref 20–29)
Calcium: 9.3 mg/dL (ref 8.7–10.2)
Chloride: 101 mmol/L (ref 96–106)
Creatinine, Ser: 1.08 mg/dL — ABNORMAL HIGH (ref 0.57–1.00)
Globulin, Total: 2.7 g/dL (ref 1.5–4.5)
Glucose: 73 mg/dL (ref 70–99)
Potassium: 4.5 mmol/L (ref 3.5–5.2)
Sodium: 137 mmol/L (ref 134–144)
Total Protein: 6.9 g/dL (ref 6.0–8.5)
eGFR: 67 mL/min/1.73 (ref >59)

## 2023-09-26 LAB — BASIC METABOLIC PANEL WITH GFR
BUN/Creatinine Ratio: 25 — ABNORMAL HIGH (ref 9–23)
BUN: 25 mg/dL — ABNORMAL HIGH (ref 6–20)
CO2: 17 mmol/L — ABNORMAL LOW (ref 20–29)
Calcium: 9.1 mg/dL (ref 8.7–10.2)
Chloride: 99 mmol/L (ref 96–106)
Creatinine, Ser: 1 mg/dL (ref 0.57–1.00)
Glucose: 72 mg/dL (ref 70–99)
Potassium: 4.3 mmol/L (ref 3.5–5.2)
Sodium: 137 mmol/L (ref 134–144)
eGFR: 73 mL/min/1.73 (ref >59)

## 2023-09-26 LAB — MAGNESIUM: Magnesium: 2.2 mg/dL (ref 1.6–2.3)

## 2023-09-30 ENCOUNTER — Ambulatory Visit: Payer: Self-pay | Admitting: Cardiovascular Disease

## 2023-10-01 ENCOUNTER — Ambulatory Visit: Payer: Self-pay | Admitting: Cardiovascular Disease

## 2023-10-01 ENCOUNTER — Ambulatory Visit: Admitting: Primary Care

## 2023-10-01 DIAGNOSIS — E7849 Other hyperlipidemia: Secondary | ICD-10-CM

## 2023-10-11 ENCOUNTER — Other Ambulatory Visit (HOSPITAL_COMMUNITY): Payer: Self-pay

## 2023-10-11 ENCOUNTER — Telehealth: Payer: Self-pay

## 2023-10-11 NOTE — Telephone Encounter (Signed)
 Pharmacy Patient Advocate Encounter   Received notification from CoverMyMeds that prior authorization for Zepbound  12.5 is required/requested.   Insurance verification completed.   The patient is insured through [facility] .   Per test claim: PA required; PA submitted to above mentioned insurance via CoverMyMeds Key/confirmation #/EOC [id number] Status is pending

## 2023-10-14 ENCOUNTER — Other Ambulatory Visit (HOSPITAL_COMMUNITY): Payer: Self-pay

## 2023-10-14 NOTE — Telephone Encounter (Signed)
 Pharmacy Patient Advocate Encounter  Received notification from [facility] that Prior Authorization for Zepbound  12.5mg /0.69ml has been DENIED.  Full denial letter will be uploaded to the media tab. See denial reason below.   PA #/Case ID/Reference #: [id number]

## 2023-10-14 NOTE — Telephone Encounter (Signed)
 Insurance company requested additional information, filled out and faxed back to [phone].

## 2023-10-22 ENCOUNTER — Other Ambulatory Visit: Payer: Self-pay | Admitting: Physician Assistant

## 2023-10-22 DIAGNOSIS — I10 Essential (primary) hypertension: Secondary | ICD-10-CM

## 2023-10-31 ENCOUNTER — Encounter: Payer: Self-pay | Admitting: Physician Assistant

## 2023-11-22 ENCOUNTER — Other Ambulatory Visit: Payer: Self-pay | Admitting: Physician Assistant

## 2023-11-22 ENCOUNTER — Encounter (HOSPITAL_BASED_OUTPATIENT_CLINIC_OR_DEPARTMENT_OTHER): Payer: Self-pay

## 2023-11-22 DIAGNOSIS — I10 Essential (primary) hypertension: Secondary | ICD-10-CM

## 2023-11-22 MED ORDER — TELMISARTAN 80 MG PO TABS: 80.0000 mg | ORAL_TABLET | Freq: Every day | ORAL | 0 refills | Status: AC

## 2023-11-22 MED ORDER — TELMISARTAN 80 MG PO TABS: 80.0000 mg | ORAL_TABLET | Freq: Every day | ORAL | 1 refills | Status: DC

## 2023-11-22 MED ORDER — ZEPBOUND 12.5 MG/0.5ML ~~LOC~~ SOAJ: 12.5000 mg | SUBCUTANEOUS | 3 refills | Status: DC

## 2023-11-22 MED ORDER — AMLODIPINE BESYLATE 10 MG PO TABS: 10.0000 mg | ORAL_TABLET | Freq: Every day | ORAL | 0 refills | Status: DC

## 2023-11-22 MED ORDER — AMLODIPINE BESYLATE 10 MG PO TABS
10.0000 mg | ORAL_TABLET | Freq: Every day | ORAL | 0 refills | Status: DC
Start: 2023-11-22 — End: 2024-01-14

## 2023-11-22 NOTE — Telephone Encounter (Unsigned)
 Copied from CRM #[id number]. Topic: Clinical - Medication Refill >> Nov 22, 2023 12:01 PM Mia F wrote: Medication: telmisartan  (MICARDIS ) 80 MG tablet   amLODipine  (NORVASC ) 10 MG tablet     tirzepatide  (ZEPBOUND ) 12.5 MG/0.5ML Pen     Has the patient contacted their pharmacy? Yes (Agent: If no, request that the patient contact the pharmacy for the refill. If patient does not wish to contact the pharmacy document the reason why and proceed with request.) (Agent: If yes, when and what did the pharmacy advise?)  This is the patient's preferred pharmacy:  [facility] [address] [place], [place] - [address] [place] [address] Phone: [phone] Fax: [phone]   Is this the correct pharmacy for this prescription? Yes If no, delete pharmacy and type the correct one.   Has the prescription been filled recently? No  Is the patient out of the medication? No  Has the patient been seen for an appointment in the last year OR does the patient have an upcoming appointment? Yes  Can we respond through MyChart? No  Agent: Please be advised that Rx refills may take up to 3 business days. We ask that you follow-up with your pharmacy.

## 2023-11-27 ENCOUNTER — Other Ambulatory Visit (HOSPITAL_COMMUNITY): Payer: Self-pay

## 2023-11-28 ENCOUNTER — Encounter (HOSPITAL_BASED_OUTPATIENT_CLINIC_OR_DEPARTMENT_OTHER): Payer: Self-pay | Admitting: Family

## 2023-11-28 ENCOUNTER — Ambulatory Visit (INDEPENDENT_AMBULATORY_CARE_PROVIDER_SITE_OTHER): Admitting: Family

## 2023-11-28 ENCOUNTER — Ambulatory Visit: Admitting: Endocrinology

## 2023-11-28 VITALS — BP 132/84 | HR 97 | Ht 68.0 in | Wt 303.8 lb

## 2023-11-28 DIAGNOSIS — E782 Mixed hyperlipidemia: Secondary | ICD-10-CM

## 2023-11-28 DIAGNOSIS — I10 Essential (primary) hypertension: Secondary | ICD-10-CM

## 2023-11-28 DIAGNOSIS — R400 Somnolence: Secondary | ICD-10-CM | POA: Diagnosis not present

## 2023-11-28 DIAGNOSIS — R011 Cardiac murmur, unspecified: Secondary | ICD-10-CM

## 2023-11-28 MED ORDER — SPIRONOLACTONE 25 MG PO TABS: ORAL_TABLET | ORAL | 3 refills | Status: DC

## 2023-11-28 NOTE — Patient Instructions (Addendum)
 Medication Instructions:   CHANGE Spironolactone  to 1.5 tablets (37.5mg ) in the morning and 25mg  in the afternoon.   Continue Telmisartan  and Amlodipine   Labwork: Your physician recommends that you return for lab work  one day in next 1-2 weeks for fasting NMR, BMET, Lp(a)   Testing/Procedures: Your physician has requested that you have an echocardiogram. Echocardiography is a painless test that uses sound waves to create images of your heart. It provides your doctor with information about the size and shape of your heart and how well your heart's chambers and valves are working. This procedure takes approximately one hour. There are no restrictions for this procedure. Please do NOT wear cologne, perfume, aftershave, or lotions (deodorant is allowed). Please arrive 15 minutes prior to your appointment time.  Please note: We ask at that you not bring children with you during ultrasound (echo/ vascular) testing. Due to room size and safety concerns, children are not allowed in the ultrasound rooms during exams. Our front office staff cannot provide observation of children in our lobby area while testing is being conducted. An adult accompanying a patient to their appointment will only be allowed in the ultrasound room at the discretion of the ultrasound technician under special circumstances. We apologize for any inconvenience.   WatchPAT?  Is a FDA cleared portable home sleep study test that uses a watch and 3 points of contact to monitor 7 different channels, including your heart rate, oxygen saturations, body position, snoring, and chest motion.  The study is easy to use from the comfort of your own home and accurately detect sleep apnea.  Before bed, you attach the chest sensor, attached the sleep apnea bracelet to your nondominant hand, and attach the finger probe.  After the study, the raw data is downloaded from the watch and scored for apnea events.   For more information:  [email or web address].   We have ordered this study and once approved by insurance it will be mailed to your home.     Follow-Up: Please follow up in 3 months in [facility] with Dr. Raford, Reche Finder, NP or Allean Mink PharmD    Special Instructions:
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
## 2023-12-16 ENCOUNTER — Other Ambulatory Visit (HOSPITAL_COMMUNITY): Payer: Self-pay

## 2023-12-17 ENCOUNTER — Other Ambulatory Visit (HOSPITAL_COMMUNITY): Payer: Self-pay

## 2023-12-17 ENCOUNTER — Ambulatory Visit (HOSPITAL_BASED_OUTPATIENT_CLINIC_OR_DEPARTMENT_OTHER): Payer: Self-pay | Admitting: Family

## 2023-12-17 ENCOUNTER — Telehealth: Payer: Self-pay | Admitting: Pharmacy Technician

## 2023-12-17 LAB — NMR, LIPOPROFILE
Cholesterol, Total: 174 mg/dL (ref 100–199)
HDL Particle Number: 18.9 umol/L — ABNORMAL LOW (ref >=30.5)
HDL-C: 23 mg/dL — ABNORMAL LOW (ref >39)
LDL Particle Number: 1755 nmol/L — ABNORMAL HIGH (ref <1000)
LDL Size: 19.6 nm — ABNORMAL LOW (ref >20.5)
LDL-C (NIH Calc): 104 mg/dL — ABNORMAL HIGH (ref 0–99)
LP-IR Score: 77 — ABNORMAL HIGH (ref <=45)
Small LDL Particle Number: 1379 nmol/L — ABNORMAL HIGH (ref <=527)
Triglycerides: 273 mg/dL — ABNORMAL HIGH (ref 0–149)

## 2023-12-17 LAB — BASIC METABOLIC PANEL WITH GFR
BUN/Creatinine Ratio: 15 (ref 9–23)
BUN: 17 mg/dL (ref 6–20)
CO2: 20 mmol/L (ref 20–29)
Calcium: 9.1 mg/dL (ref 8.7–10.2)
Chloride: 101 mmol/L (ref 96–106)
Creatinine, Ser: 1.17 mg/dL — ABNORMAL HIGH (ref 0.57–1.00)
Glucose: 85 mg/dL (ref 70–99)
Potassium: 4.4 mmol/L (ref 3.5–5.2)
Sodium: 136 mmol/L (ref 134–144)
eGFR: 61 mL/min/1.73 (ref >59)

## 2023-12-17 LAB — LIPOPROTEIN A (LPA): Lipoprotein (a): 105.2 nmol/L — ABNORMAL HIGH (ref <75.0)

## 2023-12-17 MED ORDER — ROSUVASTATIN CALCIUM 20 MG PO TABS: 20.0000 mg | ORAL_TABLET | Freq: Every day | ORAL | 2 refills | Status: AC

## 2023-12-17 NOTE — Telephone Encounter (Signed)
 Pharmacy Patient Advocate Encounter  Effective October 1st, [place] will discontinue coverage of GLP1 medications for weight loss (such as Wegovy  and Zepbound ), unless the patient has a documented history of a heart attack or stroke. Zepbound  will continue to be covered only for patients with moderate to severe sleep apnea (AHI 15-30). Because of this change, the prior authorization team will not be submitting new PA requests for GLP1 medications prescribed for weight loss between now and October 1st, as patients will be unable to continue therapy under Medicaid coverage.  Unfortunately it was also denied earlier this year due to lack of current weight and the most recent weight I see in her chart is an increase, so we probably couldn't get an approval for a continuation of therapy. For a continuation of therapy or re-auth, they require that the pt has lost 5% of their body weight.

## 2023-12-17 NOTE — Telephone Encounter (Signed)
 Pt made aware of lab results and plan by Reche Finder, NP via mychart.  Pt did view this in her mychart account.  Pt made aware via mychart that we will send in a script for crestor 20 mg daily to her preferred pharmacy and we will keep her updated when the rest of her results come in.   Rx for Crestor 20 mg po daily sent to [facility].    Called our main [facility] in [place] to inquire about NMR Lipoprofile that was missed and to add this on, and per the Representative at [facility], the specimen was collected and they are currently running this, it's just pending status at this time.  Per [facility] Rep, no need for an add on for NMR lipo, for they are running this and results should be back soon.  Will update Reche Finder, NP about this.

## 2023-12-17 NOTE — Telephone Encounter (Signed)
 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  Slight elevation in creatinine, recommend increasing oral hydration.  Normal electrolytes.   Given familial hyperlipidemia - recommend addition of Rosuvastatin 20mg  daily.   NMR was supposed to be drawn but not - can we call [facility] to see if they can add on the NMR please?

## 2024-01-03 ENCOUNTER — Ambulatory Visit (INDEPENDENT_AMBULATORY_CARE_PROVIDER_SITE_OTHER)

## 2024-01-03 DIAGNOSIS — R011 Cardiac murmur, unspecified: Secondary | ICD-10-CM | POA: Diagnosis not present

## 2024-01-03 DIAGNOSIS — I10 Essential (primary) hypertension: Secondary | ICD-10-CM

## 2024-01-03 LAB — ECHOCARDIOGRAM COMPLETE
AR max vel: 2.49 cm2
AV Area VTI: 2.63 cm2
AV Area mean vel: 2.7 cm2
AV Mean grad: 3 mmHg
AV Peak grad: 7.5 mmHg
Ao pk vel: 1.37 m/s
Area-P 1/2: 4.39 cm2
S' Lateral: 1.99 cm

## 2024-01-10 ENCOUNTER — Encounter: Payer: Self-pay | Admitting: Student

## 2024-01-10 DIAGNOSIS — E2609 Other primary hyperaldosteronism: Secondary | ICD-10-CM | POA: Insufficient documentation
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
## 2024-01-10 NOTE — Assessment & Plan Note (Signed)
 Follows with endocrinology ?

## 2024-01-13 ENCOUNTER — Ambulatory Visit: Admitting: Endocrinology

## 2024-01-14 ENCOUNTER — Ambulatory Visit: Admitting: Student

## 2024-01-14 VITALS — BP 137/82 | HR 89 | Ht 68.0 in | Wt 304.4 lb

## 2024-01-14 DIAGNOSIS — R7303 Prediabetes: Secondary | ICD-10-CM | POA: Insufficient documentation

## 2024-01-14 DIAGNOSIS — Z23 Encounter for immunization: Secondary | ICD-10-CM

## 2024-01-14 DIAGNOSIS — D649 Anemia, unspecified: Secondary | ICD-10-CM

## 2024-01-14 DIAGNOSIS — E2609 Other primary hyperaldosteronism: Secondary | ICD-10-CM | POA: Diagnosis not present

## 2024-01-14 DIAGNOSIS — R011 Cardiac murmur, unspecified: Secondary | ICD-10-CM | POA: Insufficient documentation

## 2024-01-14 DIAGNOSIS — N182 Chronic kidney disease, stage 2 (mild): Secondary | ICD-10-CM | POA: Insufficient documentation

## 2024-01-14 DIAGNOSIS — I10 Essential (primary) hypertension: Secondary | ICD-10-CM

## 2024-01-14 DIAGNOSIS — E785 Hyperlipidemia, unspecified: Secondary | ICD-10-CM | POA: Insufficient documentation

## 2024-01-14 MED ORDER — AMLODIPINE BESYLATE 10 MG PO TABS: 10.0000 mg | ORAL_TABLET | Freq: Every day | ORAL | 1 refills | Status: AC

## 2024-01-14 NOTE — Assessment & Plan Note (Signed)
 Follows with Cardiology, recent echo WNL. Per cardiology- Severe thickening of the heart muscle this is likely the cause of the murmur. Monitor and control BP.

## 2024-01-14 NOTE — Assessment & Plan Note (Addendum)
 hgba1c acceptable, minimize simple carbs. Increase exercise as tolerated. Continue current meds

## 2024-01-14 NOTE — Assessment & Plan Note (Addendum)
 Following with [facility]. Continue nephrology follow-up. Hydrate and monitor.

## 2024-01-14 NOTE — Assessment & Plan Note (Signed)
 Tolerating Statin. Encourage heart healthy diet such as MIND or DASH diet, increase exercise, avoid trans fats, simple carbohydrates and processed foods, consider a krill or fish or flaxseed oil cap daily.

## 2024-01-14 NOTE — Assessment & Plan Note (Addendum)
 Encouraged DASH or MIND diet, decrease po intake and increase exercise as tolerated. Needs 7-8 hours of sleep nightly. Avoid trans fats, eat small, frequent meals every 4-5 hours with lean proteins, complex carbs and healthy fats. Minimize simple carbs, high fat foods and processed foods.  Morbid obesity, status post sleeve gastrectomy, considering bariatric revision History of sleeve gastrectomy with plans for gastric bypass. Attending bariatric appointments.  - Continue attending bariatric appointments. -On Zepbound  12.5 mg inj weekly, consider increase pending A1C

## 2024-01-14 NOTE — Assessment & Plan Note (Signed)
 Asymptomatic. Repeat CBC.

## 2024-01-14 NOTE — Assessment & Plan Note (Signed)
 Well controlled, no changes to meds. Encouraged heart healthy diet such as the DASH diet and exercise as tolerated.

## 2024-01-29 ENCOUNTER — Other Ambulatory Visit

## 2024-01-29 DIAGNOSIS — I10 Essential (primary) hypertension: Secondary | ICD-10-CM | POA: Diagnosis not present

## 2024-01-29 DIAGNOSIS — N182 Chronic kidney disease, stage 2 (mild): Secondary | ICD-10-CM

## 2024-01-29 DIAGNOSIS — R7303 Prediabetes: Secondary | ICD-10-CM

## 2024-01-29 LAB — COMPLETE METABOLIC PANEL WITHOUT GFR
AG Ratio: 1.3 (calc) (ref 1.0–2.5)
ALT: 10 U/L (ref 6–29)
AST: 9 U/L — ABNORMAL LOW (ref 10–30)
Albumin: 4.1 g/dL (ref 3.6–5.1)
Alkaline phosphatase (APISO): 42 U/L (ref 31–125)
BUN/Creatinine Ratio: 21 (calc) (ref 6–22)
BUN: 26 mg/dL — ABNORMAL HIGH (ref 7–25)
CO2: 24 mmol/L (ref 20–32)
Calcium: 9.4 mg/dL (ref 8.6–10.2)
Chloride: 105 mmol/L (ref 98–110)
Creat: 1.24 mg/dL — ABNORMAL HIGH (ref 0.50–0.97)
Globulin: 3.1 g/dL (ref 1.9–3.7)
Glucose, Bld: 80 mg/dL (ref 65–99)
Potassium: 4.3 mmol/L (ref 3.5–5.3)
Sodium: 139 mmol/L (ref 135–146)
Total Bilirubin: 0.3 mg/dL (ref 0.2–1.2)
Total Protein: 7.2 g/dL (ref 6.1–8.1)

## 2024-01-29 LAB — CBC WITH DIFFERENTIAL/PLATELET
Basophils Absolute: 0 K/uL (ref 0.0–0.1)
Basophils Relative: 0.3 % (ref 0.0–3.0)
Eosinophils Absolute: 0.1 K/uL (ref 0.0–0.7)
Eosinophils Relative: 0.5 % (ref 0.0–5.0)
HCT: 34.6 % — ABNORMAL LOW (ref 36.0–46.0)
Hemoglobin: 11.1 g/dL — ABNORMAL LOW (ref 12.0–15.0)
Lymphocytes Relative: 23.1 % (ref 12.0–46.0)
Lymphs Abs: 2.9 K/uL (ref 0.7–4.0)
MCHC: 31.9 g/dL (ref 30.0–36.0)
MCV: 76.7 fl — ABNORMAL LOW (ref 78.0–100.0)
Monocytes Absolute: 0.6 K/uL (ref 0.1–1.0)
Monocytes Relative: 4.9 % (ref 3.0–12.0)
Neutro Abs: 8.8 K/uL — ABNORMAL HIGH (ref 1.4–7.7)
Neutrophils Relative %: 71.2 % (ref 43.0–77.0)
Platelets: 215 K/uL (ref 150.0–400.0)
RBC: 4.51 Mil/uL (ref 3.87–5.11)
RDW: 15.2 % (ref 11.5–15.5)
WBC: 12.4 K/uL — ABNORMAL HIGH (ref 4.0–10.5)

## 2024-01-29 LAB — LIPID PANEL
Cholesterol: 92 mg/dL (ref 0–200)
HDL: 31.2 mg/dL — ABNORMAL LOW (ref >39.00)
LDL Cholesterol: 41 mg/dL (ref 0–99)
NonHDL: 60.96
Total CHOL/HDL Ratio: 3
Triglycerides: 102 mg/dL (ref 0.0–149.0)
VLDL: 20.4 mg/dL (ref 0.0–40.0)

## 2024-01-29 LAB — HEMOGLOBIN A1C: Hgb A1c MFr Bld: 6.2 % (ref 4.6–6.5)

## 2024-01-29 LAB — TSH: TSH: 0.89 u[IU]/mL (ref 0.35–5.50)

## 2024-01-30 ENCOUNTER — Ambulatory Visit: Payer: Self-pay | Admitting: Student

## 2024-02-03 NOTE — Telephone Encounter (Signed)
 Tried calling to add GFR but unable to.  The test that was ordered is w/o GFR (test number 19110).  In future you will need to use Epic number [id number]

## 2024-02-06 ENCOUNTER — Ambulatory Visit: Admitting: Endocrinology

## 2024-02-11 ENCOUNTER — Institutional Professional Consult (permissible substitution) (HOSPITAL_BASED_OUTPATIENT_CLINIC_OR_DEPARTMENT_OTHER): Admitting: Internal Medicine

## 2024-02-17 ENCOUNTER — Encounter (HOSPITAL_BASED_OUTPATIENT_CLINIC_OR_DEPARTMENT_OTHER): Admitting: Cardiovascular Disease

## 2024-03-05 ENCOUNTER — Institutional Professional Consult (permissible substitution) (HOSPITAL_BASED_OUTPATIENT_CLINIC_OR_DEPARTMENT_OTHER): Admitting: Internal Medicine

## 2024-03-11 ENCOUNTER — Encounter (HOSPITAL_BASED_OUTPATIENT_CLINIC_OR_DEPARTMENT_OTHER): Payer: Self-pay | Admitting: Family

## 2024-03-11 ENCOUNTER — Ambulatory Visit (INDEPENDENT_AMBULATORY_CARE_PROVIDER_SITE_OTHER): Admitting: Family

## 2024-03-11 VITALS — BP 126/82 | HR 65 | Ht 68.0 in | Wt 304.0 lb

## 2024-03-11 DIAGNOSIS — E782 Mixed hyperlipidemia: Secondary | ICD-10-CM | POA: Diagnosis not present

## 2024-03-11 DIAGNOSIS — I10 Essential (primary) hypertension: Secondary | ICD-10-CM

## 2024-03-11 MED ORDER — HYDRALAZINE HCL 10 MG PO TABS: 10.0000 mg | ORAL_TABLET | Freq: Three times a day (TID) | ORAL | 1 refills | Status: AC | PRN

## 2024-03-11 MED ORDER — SPIRONOLACTONE 25 MG PO TABS: 25.0000 mg | ORAL_TABLET | Freq: Two times a day (BID) | ORAL | 3 refills | Status: AC

## 2024-03-11 NOTE — Patient Instructions (Signed)
 Medication Instructions:   CHANGE Spironolactone  to 25mg  (one tablet) twice per day   Labwork: Your cholesterol in November looked great!!   Follow-Up: Please follow up in 6 months in [facility] with Dr. Raford, Reche Finder, NP or Allean Mink PharmD    Special Instructions:    We will reach out to sleep team about getting your sleep study sent.   To prevent or reduce lower extremity swelling: Eat a low salt diet. Salt makes the body hold onto extra fluid which causes swelling. Sit with legs elevated. For example, in the recliner or on an ottoman.  Wear knee-high compression stockings during the daytime. Ones labeled 15-20 mmHg provide good compression.
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
## 2024-03-13 ENCOUNTER — Telehealth: Payer: Self-pay

## 2024-03-13 NOTE — Telephone Encounter (Signed)
**Note De-identified Madylyn Insco Obfuscation** " ° **Note De-Identified Victory Strollo Obfuscation** Ordering provider: Reche Finder, NP Associated diagnoses: Somnolence: R40.0 and HTN-I10 WatchPAT PA obtained on 03/13/2024 by Anira Senegal, Avelina HERO, LPN. Authorization: Per the Medicaid website, a PA is not required for a Itamar-HST (CPT Code: 04199):  Patient notified of PIN (1234) on 03/13/2024 Emrys Mckamie Notification Method: phone-No answer so I left a message on te pts VM (Ok per [facility]) advising the pt that a PA was not required for her Home Sleep Studynand I requested that she return my call so we can arrang a time for her to come by the office to pick ups a WatcPat One-HST Device..  Phone note routed to covering staff for follow-up.   "

## 2024-03-17 NOTE — Telephone Encounter (Signed)
**Note De-Identified Tracey Alvarez Obfuscation** The pt sent a [facility] message concerning when she can come by the office to pick up a WatchPAT One-HST device. I advised in the [facility] message that I would be calling her this morning.  I did call the pt and I advised her that she can come by our [address] [facility] office up on the 5th floor to pick up a device with instructions Monday through Friday from 8 am until 4 pm (She is aware that the office is closed on Thursday 03/19/2024 this week for New Years Day).  She verbalized understanding to all information given and thanked me for calling her to discuss.

## 2024-03-23 ENCOUNTER — Encounter: Payer: Self-pay | Admitting: Student

## 2024-03-24 MED ORDER — WEGOVY 1 MG/0.5ML ~~LOC~~ SOAJ: 1.0000 mg | SUBCUTANEOUS | 2 refills | Status: DC

## 2024-03-25 MED ORDER — WEGOVY 2.4 MG/0.75ML ~~LOC~~ SOAJ: 2.4000 mg | SUBCUTANEOUS | 2 refills | Status: AC

## 2024-03-25 NOTE — Addendum Note (Signed)
 Addended by: WHEELER HARLENE CROME on: 03/25/2024 12:38 PM   Modules accepted: Orders

## 2024-03-26 ENCOUNTER — Institutional Professional Consult (permissible substitution) (HOSPITAL_BASED_OUTPATIENT_CLINIC_OR_DEPARTMENT_OTHER): Admitting: Internal Medicine

## 2024-04-16 ENCOUNTER — Encounter: Payer: Self-pay | Admitting: Student

## 2024-04-16 DIAGNOSIS — E282 Polycystic ovarian syndrome: Secondary | ICD-10-CM

## 2024-04-16 DIAGNOSIS — R7303 Prediabetes: Secondary | ICD-10-CM

## 2024-04-16 MED ORDER — FREESTYLE LIBRE 3 PLUS SENSOR MISC: 3 refills | Status: AC

## 2024-04-23 ENCOUNTER — Encounter (HOSPITAL_BASED_OUTPATIENT_CLINIC_OR_DEPARTMENT_OTHER): Admitting: Family

## 2024-05-13 ENCOUNTER — Institutional Professional Consult (permissible substitution) (HOSPITAL_BASED_OUTPATIENT_CLINIC_OR_DEPARTMENT_OTHER): Admitting: Internal Medicine
